# Patient Record
Sex: Female | Born: 1965 | Race: White | Hispanic: No | Marital: Single | Smoking: Never smoker
Health system: Southern US, Community
[De-identification: ages and names within clinical notes are randomized; demographics above are authoritative.]

## PROBLEM LIST (undated history)

## (undated) ENCOUNTER — Emergency Department (HOSPITAL_COMMUNITY): Disposition: A | Discharge status: Still In Hospital | Payer: Medicaid Other

## (undated) DIAGNOSIS — N390 Urinary tract infection, site not specified: Secondary | ICD-10-CM

## (undated) DIAGNOSIS — I739 Peripheral vascular disease, unspecified: Secondary | ICD-10-CM

## (undated) DIAGNOSIS — F419 Anxiety disorder, unspecified: Secondary | ICD-10-CM

## (undated) HISTORY — DX: Peripheral vascular disease, unspecified: I73.9

## (undated) HISTORY — PX: APPENDECTOMY: SHX54

---

## 1999-01-15 ENCOUNTER — Emergency Department (HOSPITAL_COMMUNITY): Admission: EM | Admit: 1999-01-15 | Discharge: 1999-01-15 | Payer: Self-pay | Admitting: Emergency Medicine

## 1999-01-15 ENCOUNTER — Encounter: Payer: Self-pay | Admitting: Emergency Medicine

## 2001-05-01 ENCOUNTER — Emergency Department (HOSPITAL_COMMUNITY): Admission: EM | Admit: 2001-05-01 | Discharge: 2001-05-01 | Payer: Self-pay | Admitting: Emergency Medicine

## 2002-01-08 ENCOUNTER — Encounter: Admission: RE | Admit: 2002-01-08 | Discharge: 2002-04-08 | Payer: Self-pay | Admitting: Family Medicine

## 2002-05-21 ENCOUNTER — Other Ambulatory Visit: Admission: RE | Admit: 2002-05-21 | Discharge: 2002-05-21 | Payer: Self-pay | Admitting: Family Medicine

## 2002-06-04 DIAGNOSIS — F329 Major depressive disorder, single episode, unspecified: Secondary | ICD-10-CM | POA: Insufficient documentation

## 2002-06-04 DIAGNOSIS — I868 Varicose veins of other specified sites: Secondary | ICD-10-CM | POA: Insufficient documentation

## 2002-06-04 DIAGNOSIS — F3289 Other specified depressive episodes: Secondary | ICD-10-CM | POA: Insufficient documentation

## 2003-02-07 ENCOUNTER — Emergency Department (HOSPITAL_COMMUNITY): Admission: AD | Admit: 2003-02-07 | Discharge: 2003-02-07 | Payer: Self-pay | Admitting: Emergency Medicine

## 2003-06-30 ENCOUNTER — Other Ambulatory Visit: Admission: RE | Admit: 2003-06-30 | Discharge: 2003-06-30 | Payer: Self-pay | Admitting: Family Medicine

## 2004-03-25 ENCOUNTER — Emergency Department (HOSPITAL_COMMUNITY): Admission: EM | Admit: 2004-03-25 | Discharge: 2004-03-25 | Payer: Self-pay | Admitting: Emergency Medicine

## 2004-04-18 ENCOUNTER — Ambulatory Visit: Payer: Self-pay | Admitting: Family Medicine

## 2004-07-02 ENCOUNTER — Emergency Department (HOSPITAL_COMMUNITY): Admission: EM | Admit: 2004-07-02 | Discharge: 2004-07-03 | Payer: Self-pay | Admitting: Emergency Medicine

## 2004-07-05 ENCOUNTER — Ambulatory Visit: Payer: Self-pay | Admitting: Family Medicine

## 2004-08-07 ENCOUNTER — Ambulatory Visit: Payer: Self-pay | Admitting: Family Medicine

## 2004-08-09 ENCOUNTER — Emergency Department (HOSPITAL_COMMUNITY): Admission: EM | Admit: 2004-08-09 | Discharge: 2004-08-09 | Payer: Self-pay | Admitting: Emergency Medicine

## 2004-10-09 ENCOUNTER — Other Ambulatory Visit: Admission: RE | Admit: 2004-10-09 | Discharge: 2004-10-09 | Payer: Self-pay | Admitting: Family Medicine

## 2004-10-09 ENCOUNTER — Ambulatory Visit: Payer: Self-pay | Admitting: Family Medicine

## 2004-10-11 ENCOUNTER — Ambulatory Visit: Payer: Self-pay | Admitting: Family Medicine

## 2004-10-16 ENCOUNTER — Ambulatory Visit (HOSPITAL_COMMUNITY): Admission: RE | Admit: 2004-10-16 | Discharge: 2004-10-16 | Payer: Self-pay | Admitting: Family Medicine

## 2005-01-04 ENCOUNTER — Ambulatory Visit: Payer: Self-pay | Admitting: Family Medicine

## 2005-01-07 ENCOUNTER — Inpatient Hospital Stay (HOSPITAL_COMMUNITY): Admission: AD | Admit: 2005-01-07 | Discharge: 2005-01-07 | Payer: Self-pay | Admitting: Obstetrics & Gynecology

## 2005-01-09 ENCOUNTER — Ambulatory Visit: Payer: Self-pay | Admitting: Obstetrics and Gynecology

## 2005-01-29 ENCOUNTER — Ambulatory Visit: Payer: Self-pay | Admitting: Family Medicine

## 2005-02-19 ENCOUNTER — Emergency Department (HOSPITAL_COMMUNITY): Admission: EM | Admit: 2005-02-19 | Discharge: 2005-02-19 | Payer: Self-pay | Admitting: Family Medicine

## 2005-04-30 ENCOUNTER — Emergency Department (HOSPITAL_COMMUNITY): Admission: EM | Admit: 2005-04-30 | Discharge: 2005-04-30 | Payer: Self-pay | Admitting: Family Medicine

## 2005-05-30 ENCOUNTER — Emergency Department (HOSPITAL_COMMUNITY): Admission: EM | Admit: 2005-05-30 | Discharge: 2005-05-30 | Payer: Self-pay | Admitting: Emergency Medicine

## 2005-05-31 ENCOUNTER — Emergency Department (HOSPITAL_COMMUNITY): Admission: EM | Admit: 2005-05-31 | Discharge: 2005-05-31 | Payer: Self-pay | Admitting: Emergency Medicine

## 2005-07-23 ENCOUNTER — Ambulatory Visit: Payer: Self-pay | Admitting: Internal Medicine

## 2005-07-29 ENCOUNTER — Emergency Department (HOSPITAL_COMMUNITY): Admission: EM | Admit: 2005-07-29 | Discharge: 2005-07-29 | Payer: Self-pay | Admitting: Emergency Medicine

## 2005-08-09 ENCOUNTER — Ambulatory Visit: Payer: Self-pay | Admitting: Family Medicine

## 2005-08-30 ENCOUNTER — Ambulatory Visit: Payer: Self-pay | Admitting: Family Medicine

## 2005-09-14 ENCOUNTER — Emergency Department (HOSPITAL_COMMUNITY): Admission: EM | Admit: 2005-09-14 | Discharge: 2005-09-14 | Payer: Self-pay | Admitting: Family Medicine

## 2005-09-16 ENCOUNTER — Emergency Department (HOSPITAL_COMMUNITY): Admission: EM | Admit: 2005-09-16 | Discharge: 2005-09-16 | Payer: Self-pay | Admitting: Emergency Medicine

## 2005-09-27 ENCOUNTER — Emergency Department (HOSPITAL_COMMUNITY): Admission: EM | Admit: 2005-09-27 | Discharge: 2005-09-27 | Payer: Self-pay | Admitting: Emergency Medicine

## 2005-10-09 ENCOUNTER — Ambulatory Visit: Payer: Self-pay | Admitting: Internal Medicine

## 2005-10-18 ENCOUNTER — Ambulatory Visit (HOSPITAL_COMMUNITY): Admission: RE | Admit: 2005-10-18 | Discharge: 2005-10-18 | Payer: Self-pay | Admitting: Family Medicine

## 2005-10-19 ENCOUNTER — Ambulatory Visit: Payer: Self-pay | Admitting: Family Medicine

## 2005-11-05 ENCOUNTER — Other Ambulatory Visit: Admission: RE | Admit: 2005-11-05 | Discharge: 2005-11-05 | Payer: Self-pay | Admitting: Family Medicine

## 2005-11-05 ENCOUNTER — Ambulatory Visit: Payer: Self-pay | Admitting: Family Medicine

## 2005-11-05 LAB — CONVERTED CEMR LAB: Pap Smear: NORMAL

## 2005-11-14 ENCOUNTER — Ambulatory Visit: Payer: Self-pay | Admitting: Family Medicine

## 2005-11-15 ENCOUNTER — Emergency Department (HOSPITAL_COMMUNITY): Admission: EM | Admit: 2005-11-15 | Discharge: 2005-11-15 | Payer: Self-pay | Admitting: Emergency Medicine

## 2006-01-16 ENCOUNTER — Ambulatory Visit: Payer: Self-pay | Admitting: Family Medicine

## 2006-03-04 ENCOUNTER — Ambulatory Visit: Payer: Self-pay | Admitting: Family Medicine

## 2006-03-11 ENCOUNTER — Ambulatory Visit: Payer: Self-pay | Admitting: Family Medicine

## 2006-04-02 ENCOUNTER — Ambulatory Visit: Payer: Self-pay | Admitting: Family Medicine

## 2006-04-17 ENCOUNTER — Emergency Department (HOSPITAL_COMMUNITY): Admission: EM | Admit: 2006-04-17 | Discharge: 2006-04-17 | Payer: Self-pay | Admitting: Family Medicine

## 2006-04-25 ENCOUNTER — Emergency Department (HOSPITAL_COMMUNITY): Admission: EM | Admit: 2006-04-25 | Discharge: 2006-04-25 | Payer: Self-pay | Admitting: Emergency Medicine

## 2006-05-03 ENCOUNTER — Ambulatory Visit: Payer: Self-pay | Admitting: Internal Medicine

## 2006-05-14 ENCOUNTER — Ambulatory Visit: Payer: Self-pay | Admitting: Family Medicine

## 2006-08-06 ENCOUNTER — Emergency Department (HOSPITAL_COMMUNITY): Admission: EM | Admit: 2006-08-06 | Discharge: 2006-08-06 | Payer: Self-pay | Admitting: Emergency Medicine

## 2006-08-08 ENCOUNTER — Emergency Department (HOSPITAL_COMMUNITY): Admission: EM | Admit: 2006-08-08 | Discharge: 2006-08-08 | Payer: Self-pay | Admitting: Emergency Medicine

## 2006-08-12 ENCOUNTER — Ambulatory Visit: Payer: Self-pay | Admitting: Internal Medicine

## 2006-09-07 ENCOUNTER — Emergency Department (HOSPITAL_COMMUNITY): Admission: EM | Admit: 2006-09-07 | Discharge: 2006-09-07 | Payer: Self-pay | Admitting: *Deleted

## 2006-09-16 ENCOUNTER — Ambulatory Visit: Payer: Self-pay | Admitting: Family Medicine

## 2006-09-21 ENCOUNTER — Emergency Department (HOSPITAL_COMMUNITY): Admission: EM | Admit: 2006-09-21 | Discharge: 2006-09-21 | Payer: Self-pay | Admitting: Emergency Medicine

## 2006-09-23 ENCOUNTER — Inpatient Hospital Stay (HOSPITAL_COMMUNITY): Admission: AD | Admit: 2006-09-23 | Discharge: 2006-09-23 | Payer: Self-pay | Admitting: Family Medicine

## 2006-09-26 ENCOUNTER — Ambulatory Visit: Payer: Self-pay | Admitting: Internal Medicine

## 2006-10-30 ENCOUNTER — Ambulatory Visit (HOSPITAL_COMMUNITY): Admission: RE | Admit: 2006-10-30 | Discharge: 2006-10-30 | Payer: Self-pay | Admitting: Family Medicine

## 2006-11-03 ENCOUNTER — Emergency Department (HOSPITAL_COMMUNITY): Admission: EM | Admit: 2006-11-03 | Discharge: 2006-11-03 | Payer: Self-pay | Admitting: Emergency Medicine

## 2006-11-07 IMAGING — CT CT ANGIO CHEST
1 of 2 series · 20 of 30 positions shown · IV contrast (APPLIED)
Comparison: none

CLINICAL DATA: Chest pain; elevated D-dimer; heart stop
TECHNIQUE: Multidetector CT imaging of the chest was performed according to the protocol for detection of pulmonary embolism during IV bolus injection of 80 ml Omnipaque 300.  Coronal and sagittal plane reformatted images were also generated.   
 CHEST CT ANGIO WITH CONTRAST:
 There are no filling defects in the pulmonary arterial system to suggest pulmonary emboli.  The heart and great vessels are unremarkable.  There is no evidence of aortic dissection or aneurysm.  There are no pleural or pericardial effusions.  No enlarged lymph nodes are present in the axillary, mediastinal, or hilar regions.  The lungs are clear.  Left renal cyst is identified.

[Series 5: pe 1.0 b20f st · axial · 0.59mm/px · z∈[-1,+225]mm · 20 of 252 slices shown]
[im 13/252  lung]
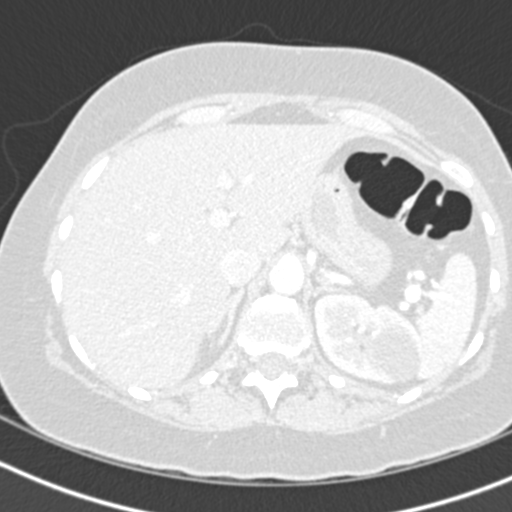
[im 26/252  mediastinal]
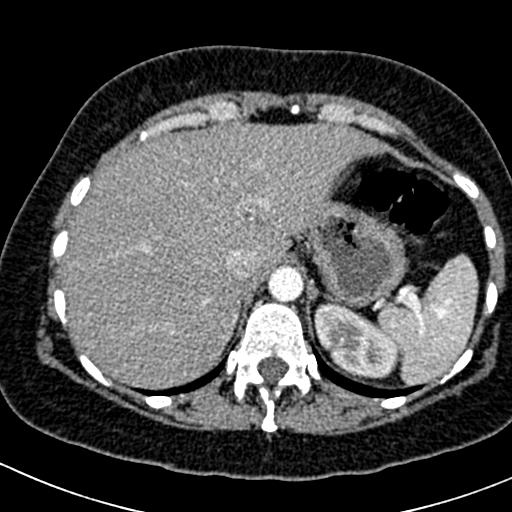
[im 38/252  lung]
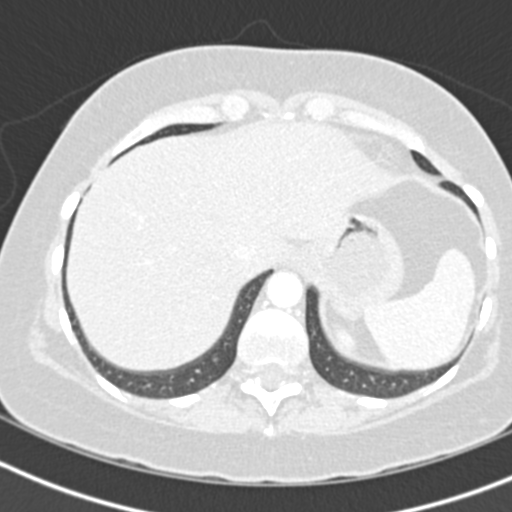
[im 51/252  mediastinal]
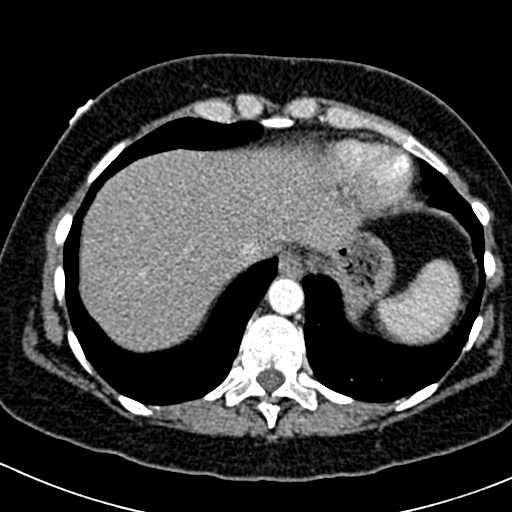
[im 63/252  lung]
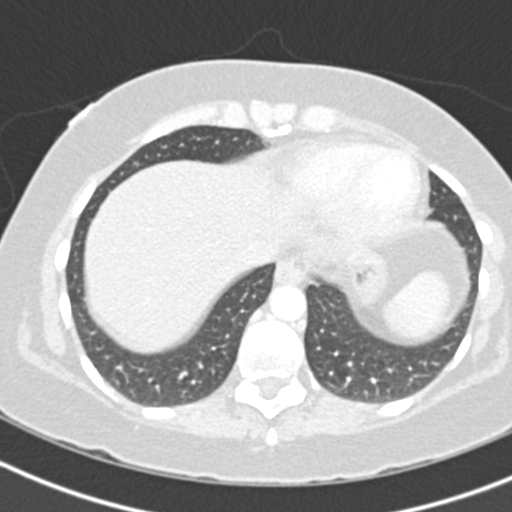
[im 76/252  mediastinal]
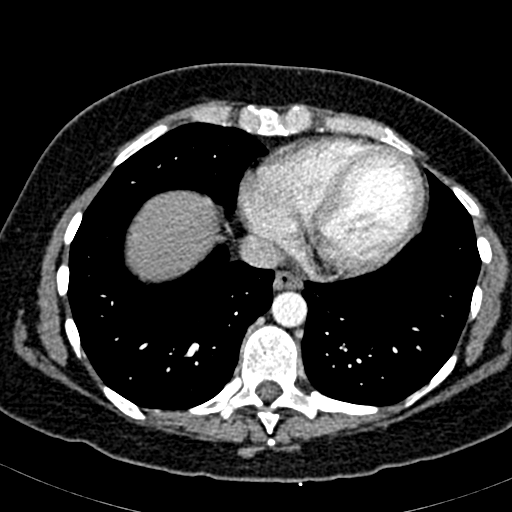
[im 88/252  lung]
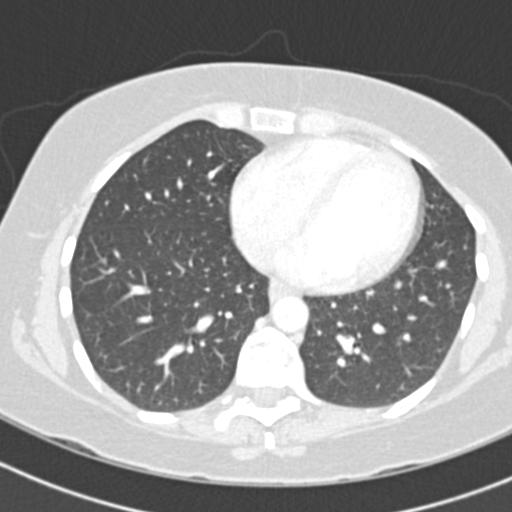
[im 101/252  mediastinal]
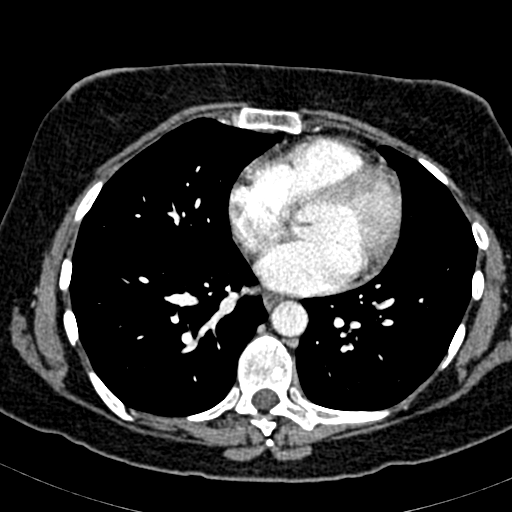
[im 113/252  lung]
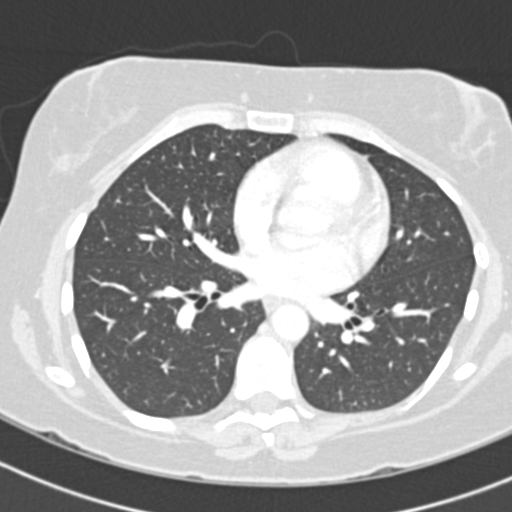
[im 117/252  mediastinal]
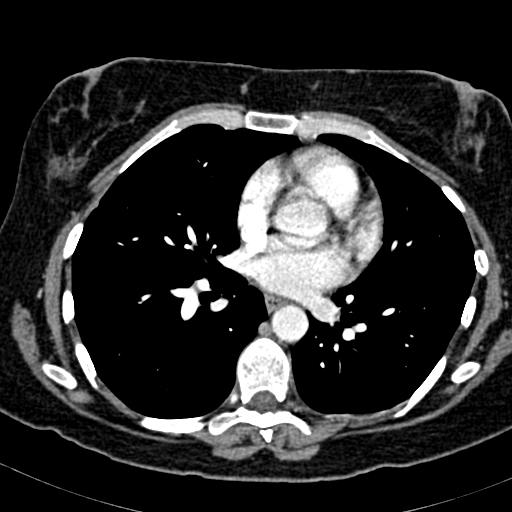
[im 126/252  lung]
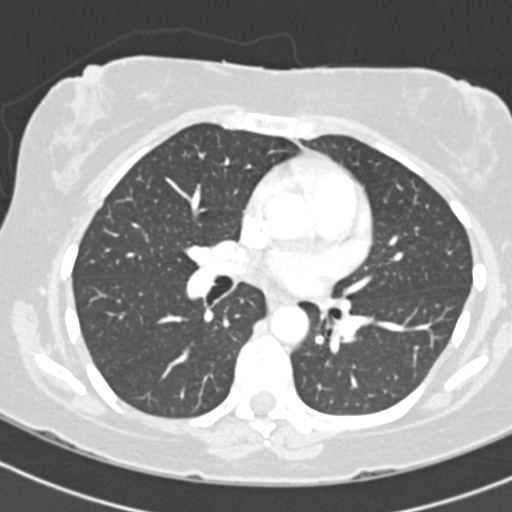
[im 139/252  mediastinal]
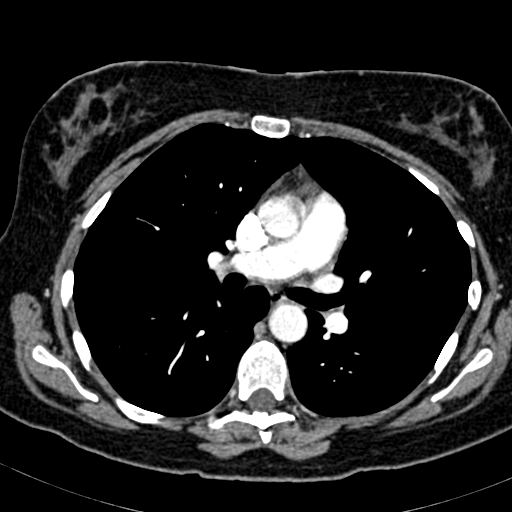
[im 151/252  lung]
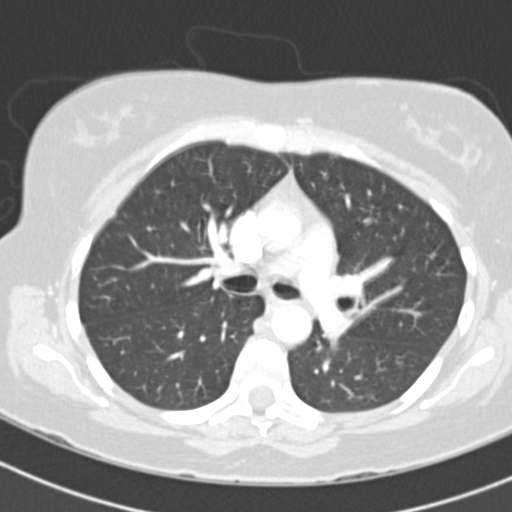
[im 164/252  mediastinal]
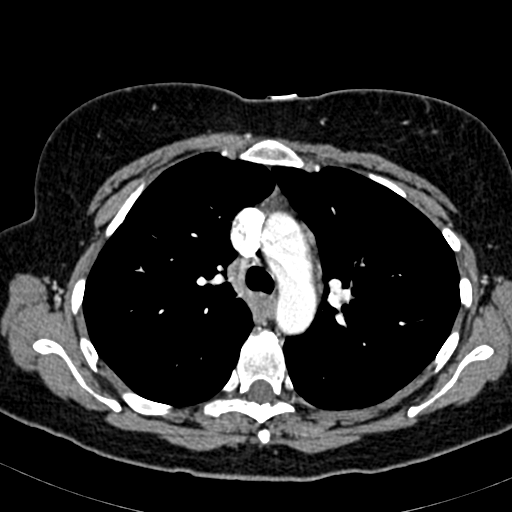
[im 176/252  lung]
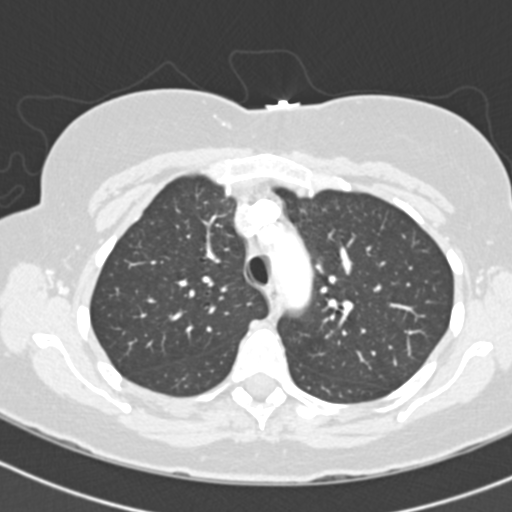
[im 189/252  mediastinal]
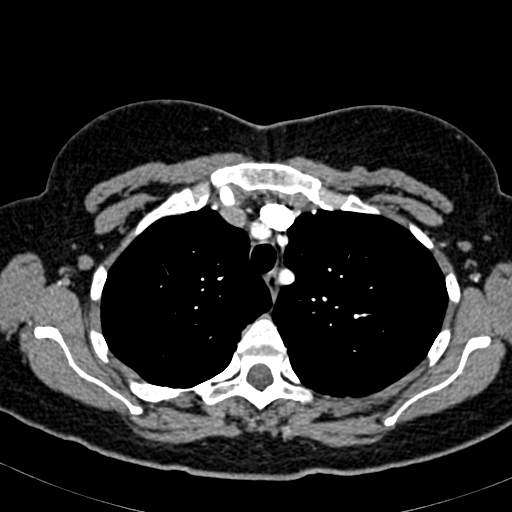
[im 201/252  lung]
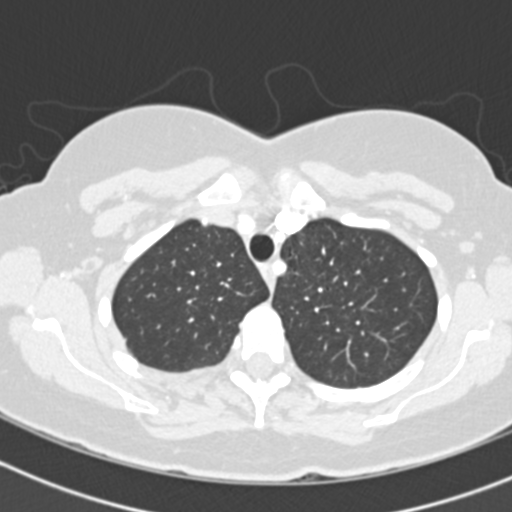
[im 214/252  mediastinal]
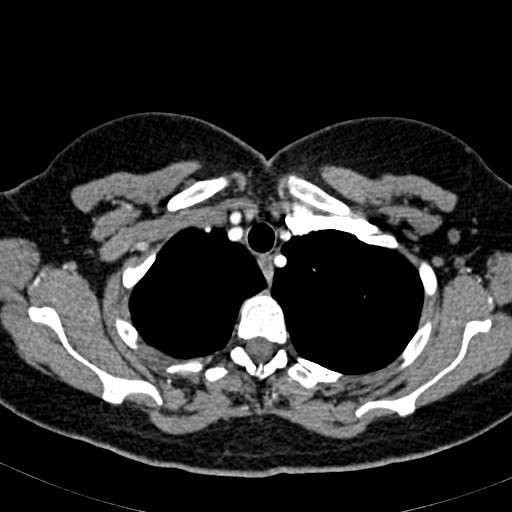
[im 226/252  lung]
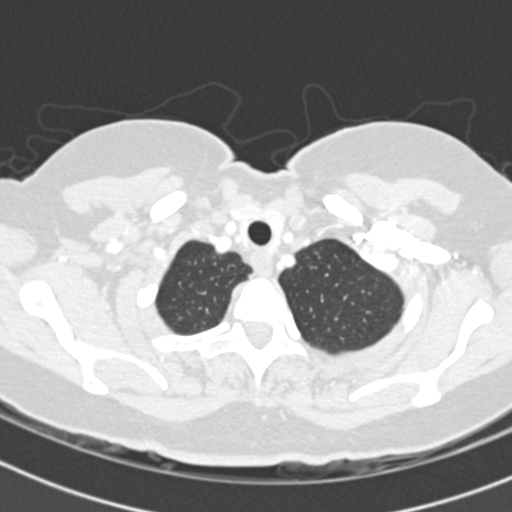
[im 239/252  mediastinal]
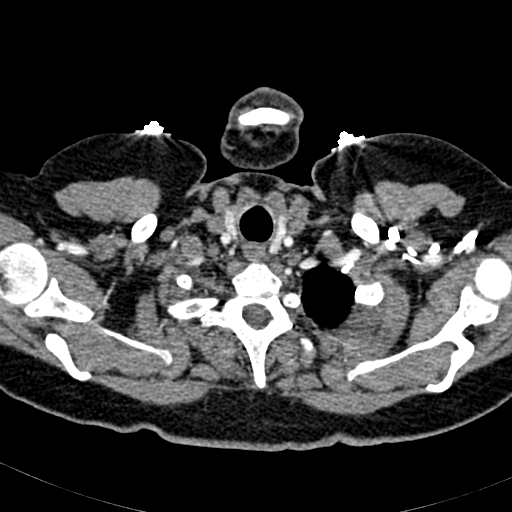

[20 of 30 positions shown; findings below may reference images not displayed]

IMPRESSION: No acute abnormality.

## 2006-12-04 ENCOUNTER — Emergency Department (HOSPITAL_COMMUNITY): Admission: EM | Admit: 2006-12-04 | Discharge: 2006-12-04 | Payer: Self-pay | Admitting: Family Medicine

## 2006-12-14 ENCOUNTER — Emergency Department (HOSPITAL_COMMUNITY): Admission: EM | Admit: 2006-12-14 | Discharge: 2006-12-14 | Payer: Self-pay | Admitting: Emergency Medicine

## 2006-12-23 ENCOUNTER — Emergency Department (HOSPITAL_COMMUNITY): Admission: EM | Admit: 2006-12-23 | Discharge: 2006-12-24 | Payer: Self-pay | Admitting: Emergency Medicine

## 2006-12-29 ENCOUNTER — Inpatient Hospital Stay (HOSPITAL_COMMUNITY): Admission: AD | Admit: 2006-12-29 | Discharge: 2006-12-29 | Payer: Self-pay | Admitting: Family Medicine

## 2006-12-31 ENCOUNTER — Emergency Department (HOSPITAL_COMMUNITY): Admission: EM | Admit: 2006-12-31 | Discharge: 2007-01-01 | Payer: Self-pay | Admitting: Emergency Medicine

## 2007-01-02 ENCOUNTER — Emergency Department (HOSPITAL_COMMUNITY): Admission: EM | Admit: 2007-01-02 | Discharge: 2007-01-02 | Payer: Self-pay | Admitting: Emergency Medicine

## 2007-01-04 ENCOUNTER — Emergency Department (HOSPITAL_COMMUNITY): Admission: EM | Admit: 2007-01-04 | Discharge: 2007-01-04 | Payer: Self-pay | Admitting: Emergency Medicine

## 2007-01-21 ENCOUNTER — Other Ambulatory Visit: Admission: RE | Admit: 2007-01-21 | Discharge: 2007-01-21 | Payer: Self-pay | Admitting: Family Medicine

## 2007-01-21 ENCOUNTER — Ambulatory Visit: Payer: Self-pay | Admitting: Family Medicine

## 2007-01-21 DIAGNOSIS — F411 Generalized anxiety disorder: Secondary | ICD-10-CM | POA: Insufficient documentation

## 2007-01-22 ENCOUNTER — Encounter (INDEPENDENT_AMBULATORY_CARE_PROVIDER_SITE_OTHER): Payer: Self-pay | Admitting: Family Medicine

## 2007-01-22 LAB — CONVERTED CEMR LAB
ALT: 18 units/L (ref 0–35)
AST: 16 units/L (ref 0–37)
Albumin: 3.9 g/dL (ref 3.5–5.2)
Alkaline Phosphatase: 65 units/L (ref 39–117)
BUN: 13 mg/dL (ref 6–23)
Basophils Absolute: 0 10*3/uL (ref 0.0–0.1)
Basophils Relative: 0 % (ref 0–1)
CO2: 21 meq/L (ref 19–32)
Calcium: 9.2 mg/dL (ref 8.4–10.5)
Chloride: 107 meq/L (ref 96–112)
Cholesterol: 127 mg/dL (ref 0–200)
Creatinine, Ser: 0.51 mg/dL (ref 0.40–1.20)
Eosinophils Absolute: 0.1 10*3/uL (ref 0.0–0.7)
Eosinophils Relative: 1 % (ref 0–5)
Free Thyroxine Index: 2.6 (ref 1.0–3.9)
GC Probe Amp, Genital: NEGATIVE
Glucose, Bld: 87 mg/dL (ref 70–99)
HCT: 37 % (ref 36.0–46.0)
HDL: 57 mg/dL (ref >39)
Hemoglobin: 11.8 g/dL — ABNORMAL LOW (ref 12.0–15.0)
LDL Cholesterol: 57 mg/dL (ref 0–99)
Lymphocytes Relative: 19 % (ref 12–46)
Lymphs Abs: 1.4 10*3/uL (ref 0.7–3.3)
MCHC: 31.9 g/dL (ref 30.0–36.0)
MCV: 81 fL (ref 78.0–100.0)
Monocytes Absolute: 0.5 10*3/uL (ref 0.2–0.7)
Monocytes Relative: 7 % (ref 3–11)
Neutro Abs: 5.5 10*3/uL (ref 1.7–7.7)
Neutrophils Relative %: 74 % (ref 43–77)
Platelets: 323 10*3/uL (ref 150–400)
Potassium: 4.3 meq/L (ref 3.5–5.3)
RBC: 4.57 M/uL (ref 3.87–5.11)
RDW: 16.6 % — ABNORMAL HIGH (ref 11.5–14.0)
Sodium: 138 meq/L (ref 135–145)
T3 Uptake Ratio: 26.3 % (ref 22.5–37.0)
T4, Total: 9.8 ug/dL (ref 5.0–12.5)
TSH: 1.556 microintl units/mL (ref 0.350–5.50)
Total Bilirubin: 0.5 mg/dL (ref 0.3–1.2)
Total CHOL/HDL Ratio: 2.2
Total Protein: 7.1 g/dL (ref 6.0–8.3)
Triglycerides: 66 mg/dL (ref <150)
VLDL: 13 mg/dL (ref 0–40)
WBC: 7.5 10*3/uL (ref 4.0–10.5)

## 2007-01-27 DIAGNOSIS — IMO0002 Reserved for concepts with insufficient information to code with codable children: Secondary | ICD-10-CM | POA: Insufficient documentation

## 2007-01-27 DIAGNOSIS — F410 Panic disorder [episodic paroxysmal anxiety] without agoraphobia: Secondary | ICD-10-CM | POA: Insufficient documentation

## 2007-01-29 ENCOUNTER — Telehealth (INDEPENDENT_AMBULATORY_CARE_PROVIDER_SITE_OTHER): Payer: Self-pay | Admitting: *Deleted

## 2007-02-03 ENCOUNTER — Emergency Department (HOSPITAL_COMMUNITY): Admission: EM | Admit: 2007-02-03 | Discharge: 2007-02-03 | Payer: Self-pay | Admitting: Emergency Medicine

## 2007-02-16 ENCOUNTER — Emergency Department (HOSPITAL_COMMUNITY): Admission: EM | Admit: 2007-02-16 | Discharge: 2007-02-16 | Payer: Self-pay | Admitting: Emergency Medicine

## 2007-02-19 ENCOUNTER — Telehealth (INDEPENDENT_AMBULATORY_CARE_PROVIDER_SITE_OTHER): Payer: Self-pay | Admitting: *Deleted

## 2007-03-05 ENCOUNTER — Inpatient Hospital Stay (HOSPITAL_COMMUNITY): Admission: AD | Admit: 2007-03-05 | Discharge: 2007-03-05 | Payer: Self-pay | Admitting: Obstetrics & Gynecology

## 2007-03-06 ENCOUNTER — Encounter (INDEPENDENT_AMBULATORY_CARE_PROVIDER_SITE_OTHER): Payer: Self-pay | Admitting: Family Medicine

## 2007-03-06 ENCOUNTER — Emergency Department (HOSPITAL_COMMUNITY): Admission: EM | Admit: 2007-03-06 | Discharge: 2007-03-06 | Payer: Self-pay | Admitting: *Deleted

## 2007-03-06 ENCOUNTER — Telehealth (INDEPENDENT_AMBULATORY_CARE_PROVIDER_SITE_OTHER): Payer: Self-pay | Admitting: Family Medicine

## 2007-03-07 ENCOUNTER — Emergency Department (HOSPITAL_COMMUNITY): Admission: EM | Admit: 2007-03-07 | Discharge: 2007-03-07 | Payer: Self-pay | Admitting: Family Medicine

## 2007-03-08 ENCOUNTER — Inpatient Hospital Stay (HOSPITAL_COMMUNITY): Admission: AD | Admit: 2007-03-08 | Discharge: 2007-03-08 | Payer: Self-pay | Admitting: Obstetrics & Gynecology

## 2007-03-21 ENCOUNTER — Emergency Department (HOSPITAL_COMMUNITY): Admission: EM | Admit: 2007-03-21 | Discharge: 2007-03-21 | Payer: Self-pay | Admitting: Emergency Medicine

## 2007-04-04 ENCOUNTER — Emergency Department (HOSPITAL_COMMUNITY): Admission: EM | Admit: 2007-04-04 | Discharge: 2007-04-04 | Payer: Self-pay | Admitting: Emergency Medicine

## 2007-04-06 ENCOUNTER — Emergency Department (HOSPITAL_COMMUNITY): Admission: EM | Admit: 2007-04-06 | Discharge: 2007-04-07 | Payer: Self-pay | Admitting: Emergency Medicine

## 2007-04-09 ENCOUNTER — Emergency Department (HOSPITAL_COMMUNITY): Admission: EM | Admit: 2007-04-09 | Discharge: 2007-04-09 | Payer: Self-pay | Admitting: Emergency Medicine

## 2007-04-10 ENCOUNTER — Emergency Department (HOSPITAL_COMMUNITY): Admission: EM | Admit: 2007-04-10 | Discharge: 2007-04-11 | Payer: Self-pay | Admitting: Emergency Medicine

## 2007-04-10 ENCOUNTER — Emergency Department (HOSPITAL_COMMUNITY): Admission: EM | Admit: 2007-04-10 | Discharge: 2007-04-10 | Payer: Self-pay | Admitting: Emergency Medicine

## 2007-04-14 ENCOUNTER — Emergency Department (HOSPITAL_COMMUNITY): Admission: EM | Admit: 2007-04-14 | Discharge: 2007-04-14 | Payer: Self-pay | Admitting: Emergency Medicine

## 2007-04-26 ENCOUNTER — Emergency Department (HOSPITAL_COMMUNITY): Admission: EM | Admit: 2007-04-26 | Discharge: 2007-04-26 | Payer: Self-pay | Admitting: Emergency Medicine

## 2007-06-26 ENCOUNTER — Emergency Department (HOSPITAL_COMMUNITY): Admission: EM | Admit: 2007-06-26 | Discharge: 2007-06-26 | Payer: Self-pay | Admitting: Family Medicine

## 2007-07-22 ENCOUNTER — Emergency Department (HOSPITAL_COMMUNITY): Admission: EM | Admit: 2007-07-22 | Discharge: 2007-07-22 | Payer: Self-pay | Admitting: Emergency Medicine

## 2007-08-08 ENCOUNTER — Emergency Department (HOSPITAL_COMMUNITY): Admission: EM | Admit: 2007-08-08 | Discharge: 2007-08-08 | Payer: Self-pay | Admitting: Family Medicine

## 2007-08-16 ENCOUNTER — Encounter (INDEPENDENT_AMBULATORY_CARE_PROVIDER_SITE_OTHER): Payer: Self-pay | Admitting: Family Medicine

## 2007-08-17 ENCOUNTER — Emergency Department (HOSPITAL_COMMUNITY): Admission: EM | Admit: 2007-08-17 | Discharge: 2007-08-17 | Payer: Self-pay | Admitting: Emergency Medicine

## 2007-10-31 ENCOUNTER — Ambulatory Visit (HOSPITAL_COMMUNITY): Admission: RE | Admit: 2007-10-31 | Discharge: 2007-10-31 | Payer: Self-pay | Admitting: Family Medicine

## 2008-01-22 ENCOUNTER — Inpatient Hospital Stay (HOSPITAL_COMMUNITY): Admission: AD | Admit: 2008-01-22 | Discharge: 2008-01-22 | Payer: Self-pay | Admitting: Family Medicine

## 2008-02-07 ENCOUNTER — Emergency Department: Payer: Self-pay | Admitting: Emergency Medicine

## 2008-03-05 ENCOUNTER — Emergency Department (HOSPITAL_COMMUNITY): Admission: EM | Admit: 2008-03-05 | Discharge: 2008-03-05 | Payer: Self-pay | Admitting: Emergency Medicine

## 2008-03-10 ENCOUNTER — Encounter (INDEPENDENT_AMBULATORY_CARE_PROVIDER_SITE_OTHER): Payer: Self-pay | Admitting: Family Medicine

## 2008-03-10 ENCOUNTER — Other Ambulatory Visit: Admission: RE | Admit: 2008-03-10 | Discharge: 2008-03-10 | Payer: Self-pay | Admitting: Family Medicine

## 2008-03-10 ENCOUNTER — Ambulatory Visit: Payer: Self-pay | Admitting: Family Medicine

## 2008-03-10 DIAGNOSIS — G4700 Insomnia, unspecified: Secondary | ICD-10-CM | POA: Insufficient documentation

## 2008-03-10 DIAGNOSIS — M545 Low back pain, unspecified: Secondary | ICD-10-CM | POA: Insufficient documentation

## 2008-03-10 LAB — CONVERTED CEMR LAB
ALT: 17 U/L (ref 0–35)
AST: 15 U/L (ref 0–37)
Albumin: 4.1 g/dL (ref 3.5–5.2)
Alkaline Phosphatase: 74 U/L (ref 39–117)
BUN: 13 mg/dL (ref 6–23)
Basophils Absolute: 0.1 K/uL (ref 0.0–0.1)
Basophils Relative: 1 % (ref 0–1)
Bilirubin Urine: NEGATIVE
Blood in Urine, dipstick: NEGATIVE
CO2: 23 meq/L (ref 19–32)
Calcium: 9.7 mg/dL (ref 8.4–10.5)
Chlamydia, DNA Probe: NEGATIVE
Chloride: 102 meq/L (ref 96–112)
Cholesterol: 160 mg/dL (ref 0–200)
Creatinine, Ser: 0.63 mg/dL (ref 0.40–1.20)
Eosinophils Absolute: 0.1 K/uL (ref 0.0–0.7)
Eosinophils Relative: 1 % (ref 0–5)
GC Probe Amp, Genital: NEGATIVE
Glucose, Bld: 78 mg/dL (ref 70–99)
Glucose, Urine, Semiquant: NEGATIVE
HCT: 38.5 % (ref 36.0–46.0)
HDL: 62 mg/dL (ref >39)
Hemoglobin: 11.9 g/dL — ABNORMAL LOW (ref 12.0–15.0)
Ketones, urine, test strip: NEGATIVE
LDL Cholesterol: 72 mg/dL (ref 0–99)
Lymphocytes Relative: 28 % (ref 12–46)
Lymphs Abs: 2.4 K/uL (ref 0.7–4.0)
MCHC: 30.9 g/dL (ref 30.0–36.0)
MCV: 82.1 fL (ref 78.0–100.0)
Monocytes Absolute: 0.8 K/uL (ref 0.1–1.0)
Monocytes Relative: 10 % (ref 3–12)
Neutro Abs: 5.1 K/uL (ref 1.7–7.7)
Neutrophils Relative %: 60 % (ref 43–77)
Nitrite: NEGATIVE
Platelets: 347 K/uL (ref 150–400)
Potassium: 4.2 meq/L (ref 3.5–5.3)
Protein, U semiquant: NEGATIVE
RBC: 4.69 M/uL (ref 3.87–5.11)
RDW: 16.4 % — ABNORMAL HIGH (ref 11.5–15.5)
Sodium: 137 meq/L (ref 135–145)
Specific Gravity, Urine: 1.015
TSH: 1.63 u[IU]/mL (ref 0.350–4.50)
Total Bilirubin: 0.4 mg/dL (ref 0.3–1.2)
Total CHOL/HDL Ratio: 2.6
Total Protein: 7.2 g/dL (ref 6.0–8.3)
Triglycerides: 130 mg/dL (ref <150)
Urobilinogen, UA: 0.2
VLDL: 26 mg/dL (ref 0–40)
WBC Urine, dipstick: NEGATIVE
WBC: 8.5 10*3/microliter (ref 4.0–10.5)
pH: 7.5

## 2008-03-12 ENCOUNTER — Telehealth (INDEPENDENT_AMBULATORY_CARE_PROVIDER_SITE_OTHER): Payer: Self-pay | Admitting: Family Medicine

## 2008-03-18 ENCOUNTER — Telehealth (INDEPENDENT_AMBULATORY_CARE_PROVIDER_SITE_OTHER): Payer: Self-pay | Admitting: *Deleted

## 2008-03-24 ENCOUNTER — Encounter (INDEPENDENT_AMBULATORY_CARE_PROVIDER_SITE_OTHER): Payer: Self-pay | Admitting: Family Medicine

## 2008-05-17 ENCOUNTER — Emergency Department (HOSPITAL_COMMUNITY): Admission: EM | Admit: 2008-05-17 | Discharge: 2008-05-17 | Payer: Self-pay | Admitting: Emergency Medicine

## 2008-09-06 ENCOUNTER — Ambulatory Visit: Payer: Self-pay | Admitting: Family Medicine

## 2008-09-06 DIAGNOSIS — J069 Acute upper respiratory infection, unspecified: Secondary | ICD-10-CM | POA: Insufficient documentation

## 2008-09-06 DIAGNOSIS — L821 Other seborrheic keratosis: Secondary | ICD-10-CM | POA: Insufficient documentation

## 2008-09-06 LAB — CONVERTED CEMR LAB: Rapid Strep: NEGATIVE

## 2008-09-09 ENCOUNTER — Ambulatory Visit: Payer: Self-pay | Admitting: Family Medicine

## 2008-12-02 ENCOUNTER — Telehealth (INDEPENDENT_AMBULATORY_CARE_PROVIDER_SITE_OTHER): Payer: Self-pay | Admitting: Internal Medicine

## 2008-12-02 ENCOUNTER — Encounter (INDEPENDENT_AMBULATORY_CARE_PROVIDER_SITE_OTHER): Payer: Self-pay | Admitting: Internal Medicine

## 2008-12-02 ENCOUNTER — Ambulatory Visit (HOSPITAL_COMMUNITY): Admission: RE | Admit: 2008-12-02 | Discharge: 2008-12-02 | Payer: Self-pay | Admitting: Family Medicine

## 2008-12-08 ENCOUNTER — Encounter: Admission: RE | Admit: 2008-12-08 | Discharge: 2008-12-08 | Payer: Self-pay | Admitting: Family Medicine

## 2008-12-09 ENCOUNTER — Encounter (INDEPENDENT_AMBULATORY_CARE_PROVIDER_SITE_OTHER): Payer: Self-pay | Admitting: Nurse Practitioner

## 2008-12-10 ENCOUNTER — Telehealth (INDEPENDENT_AMBULATORY_CARE_PROVIDER_SITE_OTHER): Payer: Self-pay | Admitting: Internal Medicine

## 2008-12-15 ENCOUNTER — Encounter (INDEPENDENT_AMBULATORY_CARE_PROVIDER_SITE_OTHER): Payer: Self-pay | Admitting: Family Medicine

## 2009-01-24 ENCOUNTER — Ambulatory Visit: Payer: Self-pay | Admitting: Nurse Practitioner

## 2009-01-24 DIAGNOSIS — K5900 Constipation, unspecified: Secondary | ICD-10-CM | POA: Insufficient documentation

## 2009-01-24 DIAGNOSIS — K219 Gastro-esophageal reflux disease without esophagitis: Secondary | ICD-10-CM | POA: Insufficient documentation

## 2009-01-24 DIAGNOSIS — N926 Irregular menstruation, unspecified: Secondary | ICD-10-CM | POA: Insufficient documentation

## 2009-01-24 DIAGNOSIS — N63 Unspecified lump in unspecified breast: Secondary | ICD-10-CM | POA: Insufficient documentation

## 2009-01-24 LAB — CONVERTED CEMR LAB
Bilirubin Urine: NEGATIVE
Blood in Urine, dipstick: NEGATIVE
Glucose, Urine, Semiquant: NEGATIVE
Ketones, urine, test strip: NEGATIVE
Nitrite: NEGATIVE
Protein, U semiquant: NEGATIVE
Specific Gravity, Urine: 1.015
Urobilinogen, UA: 0.2
WBC Urine, dipstick: NEGATIVE
pH: 7

## 2009-06-02 ENCOUNTER — Ambulatory Visit: Payer: Self-pay | Admitting: Nurse Practitioner

## 2009-06-02 DIAGNOSIS — L719 Rosacea, unspecified: Secondary | ICD-10-CM | POA: Insufficient documentation

## 2009-06-02 LAB — CONVERTED CEMR LAB
ALT: 24 units/L (ref 0–35)
AST: 17 units/L (ref 0–37)
Albumin: 4.1 g/dL (ref 3.5–5.2)
Alkaline Phosphatase: 81 units/L (ref 39–117)
BUN: 11 mg/dL (ref 6–23)
Basophils Absolute: 0.1 10*3/uL (ref 0.0–0.1)
Basophils Relative: 1 % (ref 0–1)
Bilirubin Urine: NEGATIVE
Blood in Urine, dipstick: NEGATIVE
CO2: 23 meq/L (ref 19–32)
Calcium: 9.3 mg/dL (ref 8.4–10.5)
Chlamydia, DNA Probe: NEGATIVE
Chloride: 102 meq/L (ref 96–112)
Cholesterol: 159 mg/dL (ref 0–200)
Creatinine, Ser: 0.42 mg/dL (ref 0.40–1.20)
Eosinophils Absolute: 0.1 10*3/uL (ref 0.0–0.7)
Eosinophils Relative: 1 % (ref 0–5)
GC Probe Amp, Genital: NEGATIVE
Glucose, Bld: 75 mg/dL (ref 70–99)
Glucose, Urine, Semiquant: NEGATIVE
HCT: 36.6 % (ref 36.0–46.0)
HDL: 62 mg/dL (ref >39)
Hemoglobin: 11.7 g/dL — ABNORMAL LOW (ref 12.0–15.0)
KOH Prep: NEGATIVE
Ketones, urine, test strip: NEGATIVE
LDL Cholesterol: 83 mg/dL (ref 0–99)
Lymphocytes Relative: 25 % (ref 12–46)
Lymphs Abs: 2.4 10*3/uL (ref 0.7–4.0)
MCHC: 32 g/dL (ref 30.0–36.0)
MCV: 80.1 fL (ref 78.0–100.0)
Monocytes Absolute: 0.7 10*3/uL (ref 0.1–1.0)
Monocytes Relative: 7 % (ref 3–12)
Neutro Abs: 6.4 10*3/uL (ref 1.7–7.7)
Neutrophils Relative %: 66 % (ref 43–77)
Nitrite: NEGATIVE
OCCULT 1: NEGATIVE
Platelets: 400 10*3/uL (ref 150–400)
Potassium: 4.9 meq/L (ref 3.5–5.3)
Protein, U semiquant: NEGATIVE
RBC: 4.57 M/uL (ref 3.87–5.11)
RDW: 16.8 % — ABNORMAL HIGH (ref 11.5–15.5)
Rapid HIV Screen: NEGATIVE
Sodium: 138 meq/L (ref 135–145)
Specific Gravity, Urine: 1.01
TSH: 1.52 microintl units/mL (ref 0.350–4.500)
Total Bilirubin: 0.4 mg/dL (ref 0.3–1.2)
Total CHOL/HDL Ratio: 2.6
Total Protein: 7.2 g/dL (ref 6.0–8.3)
Triglycerides: 70 mg/dL (ref <150)
Urobilinogen, UA: 0.2
VLDL: 14 mg/dL (ref 0–40)
WBC Urine, dipstick: NEGATIVE
WBC: 9.7 10*3/uL (ref 4.0–10.5)
pH: 6.5

## 2009-06-06 ENCOUNTER — Telehealth (INDEPENDENT_AMBULATORY_CARE_PROVIDER_SITE_OTHER): Payer: Self-pay | Admitting: Nurse Practitioner

## 2009-06-09 ENCOUNTER — Encounter: Admission: RE | Admit: 2009-06-09 | Discharge: 2009-06-09 | Payer: Self-pay | Admitting: Internal Medicine

## 2009-06-10 ENCOUNTER — Encounter (INDEPENDENT_AMBULATORY_CARE_PROVIDER_SITE_OTHER): Payer: Self-pay | Admitting: Nurse Practitioner

## 2009-06-10 ENCOUNTER — Telehealth (INDEPENDENT_AMBULATORY_CARE_PROVIDER_SITE_OTHER): Payer: Self-pay | Admitting: Nurse Practitioner

## 2009-07-13 IMAGING — US US PELVIS COMPLETE MODIFY
1 series · 14 of 25 positions shown · non-contrast
Comparison: none

CLINICAL DATA: Heavy bleeding. Last menstrual period was [REDACTED]. 
 TRANSABDOMINAL AND TRANSVAGINAL PELVIC ULTRASOUND:
TECHNIQUE: Both transabdominal and transvaginal ultrasound examinations of the pelvis were performed including evaluation of the uterus, ovaries, adnexal regions, and pelvic cul-de-sac.

[Series 1: us transvaginal non-ob · 14 of 40 slices shown]
[im 1/40]
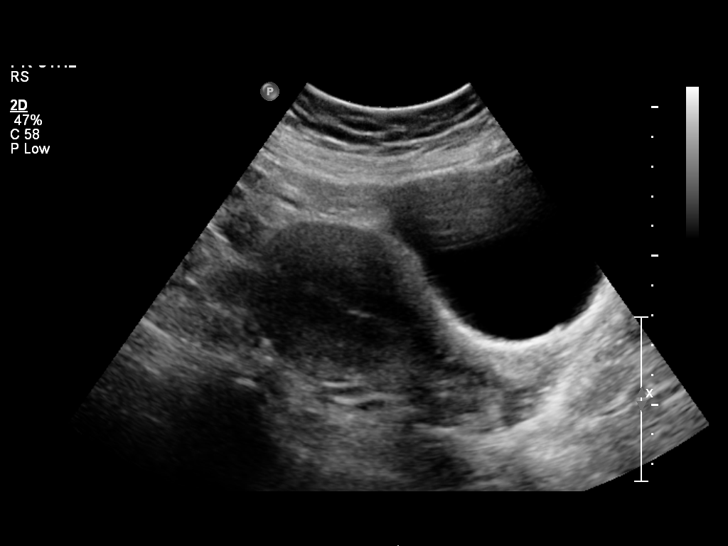
[im 4/40]
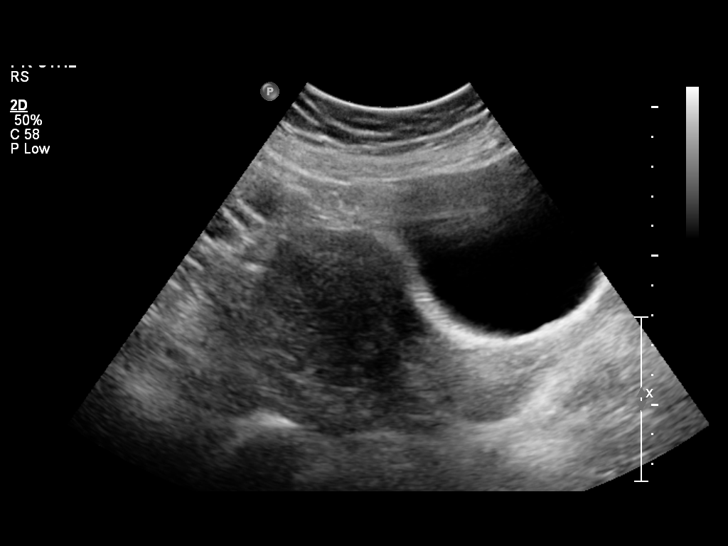
[im 7/40]
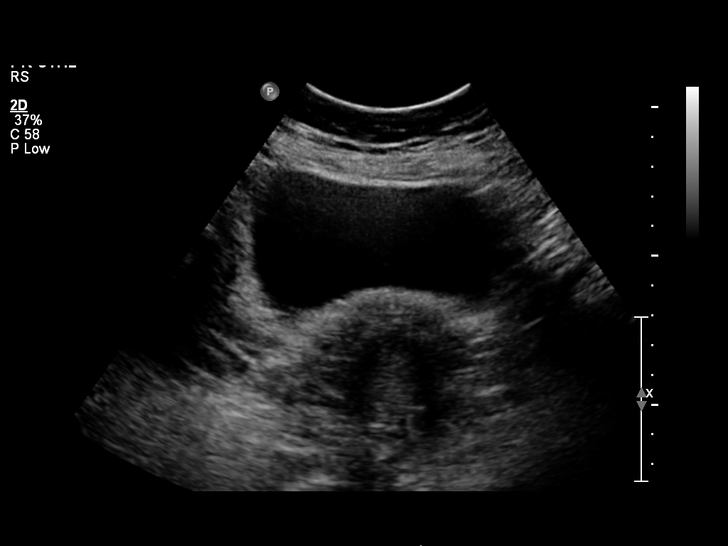
[im 10/40]
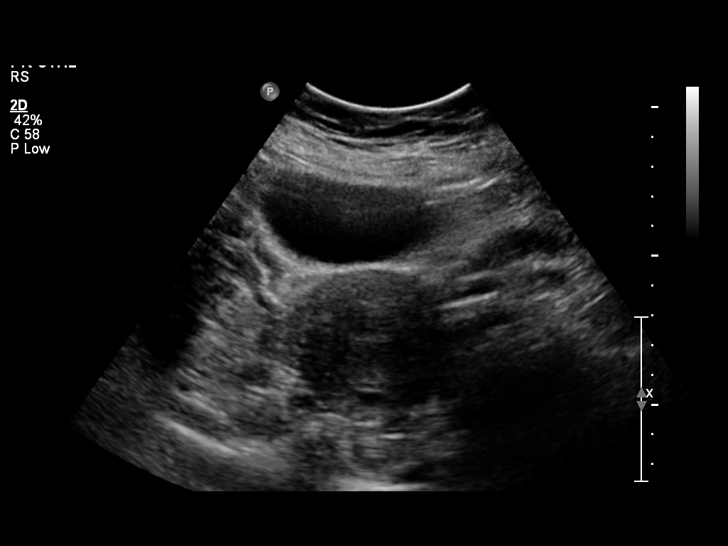
[im 14/40]
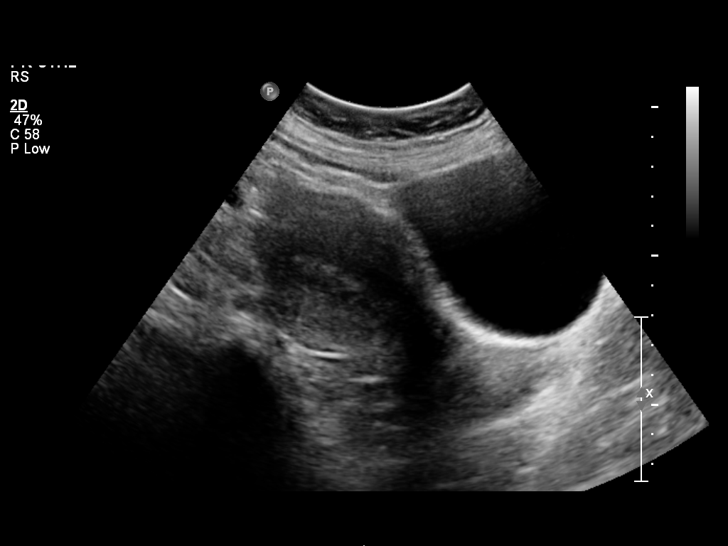
[im 15/40]
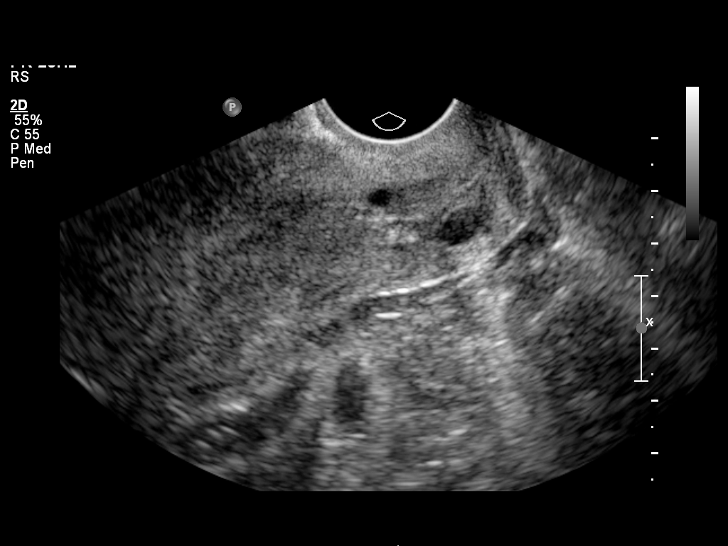
[im 18/40]
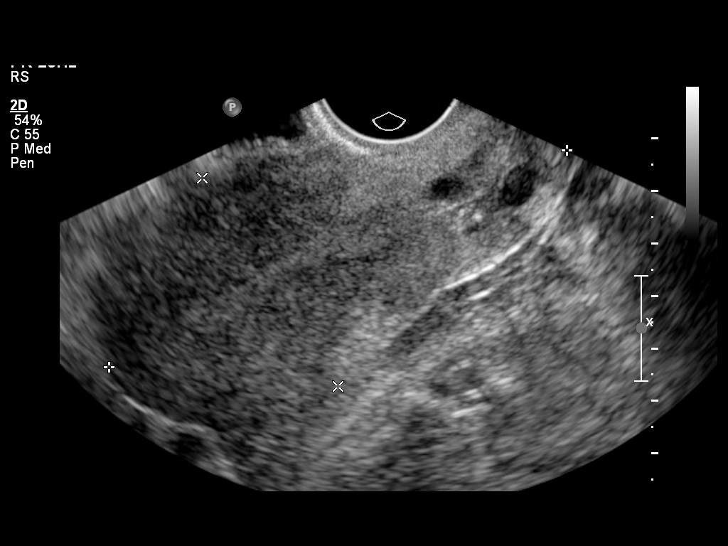
[im 22/40]
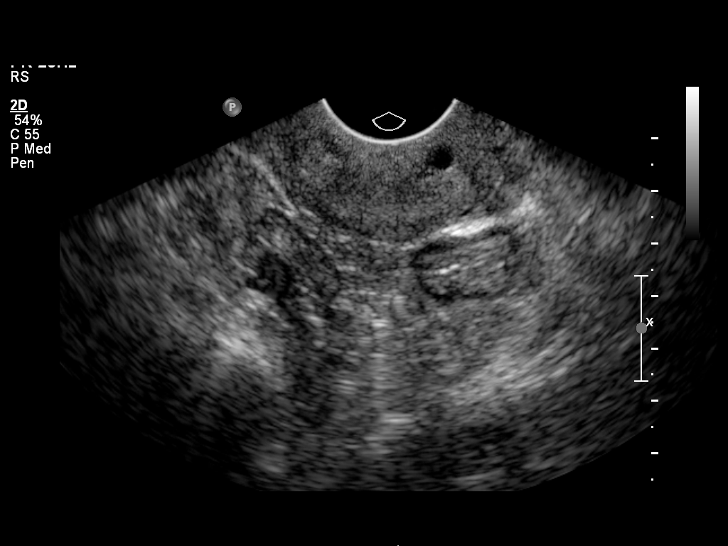
[im 25/40]
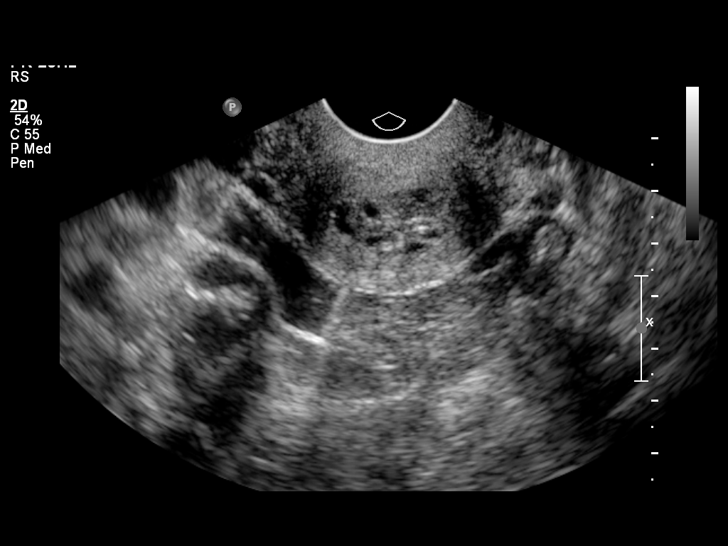
[im 27/40]
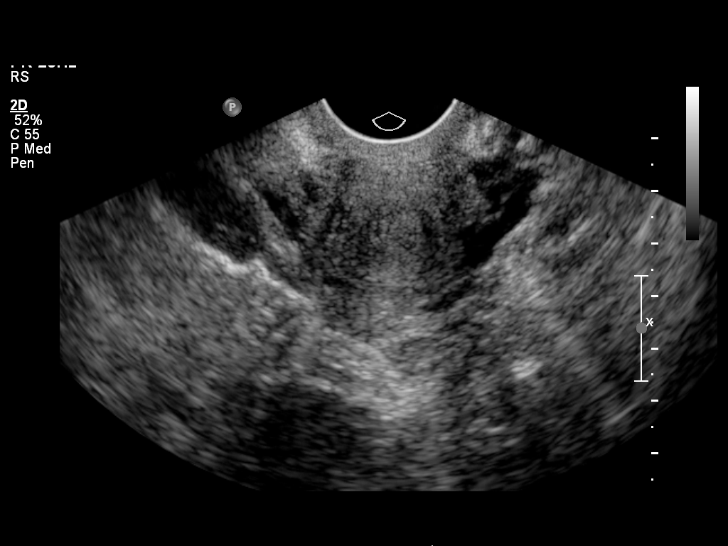
[im 30/40]
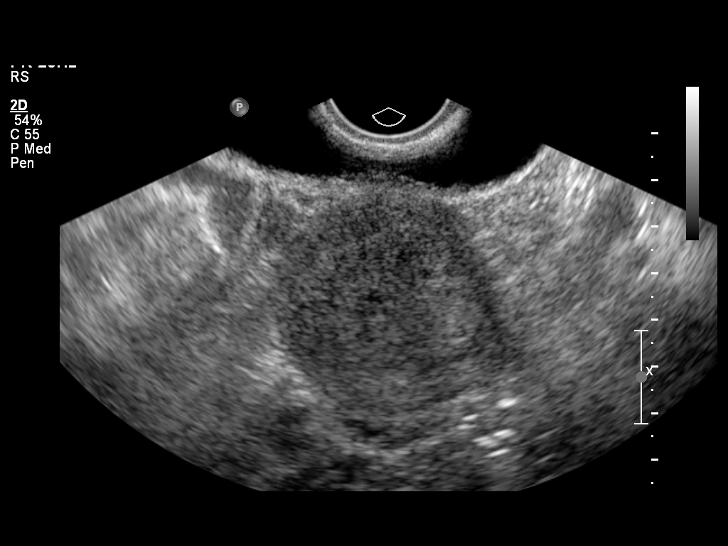
[im 33/40]
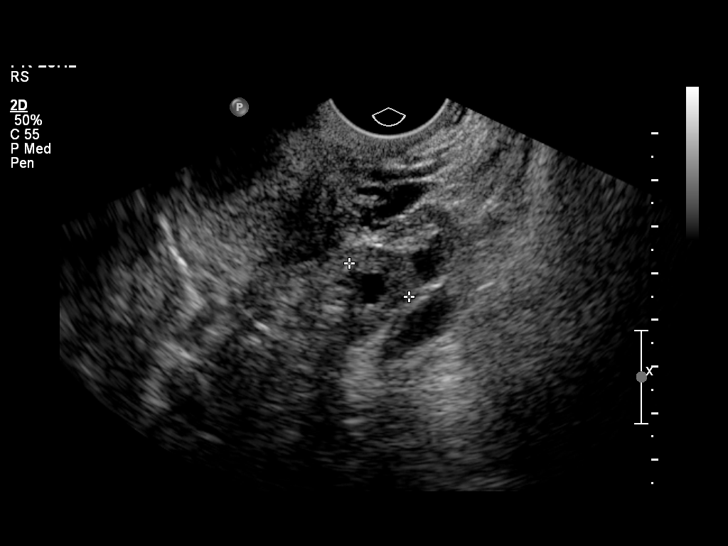
[im 36/40]
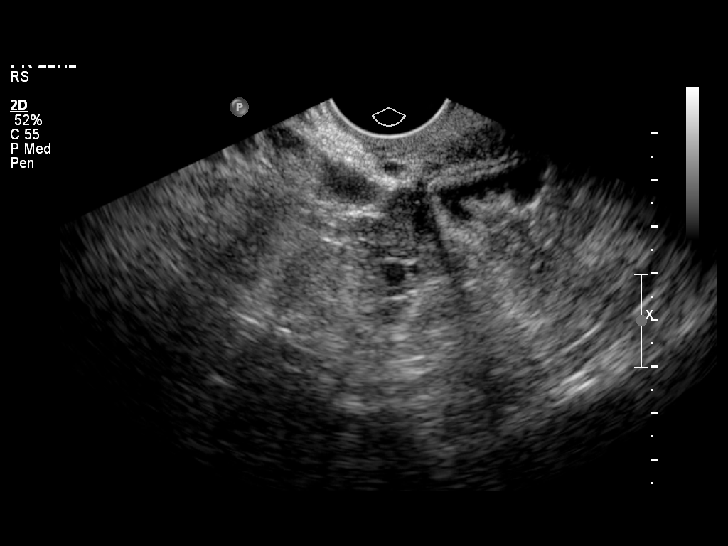
[im 40/40]
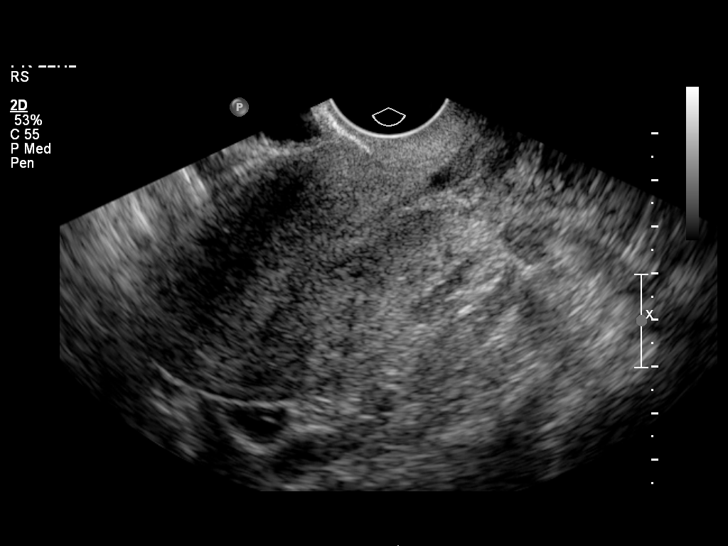

[14 of 25 positions shown; findings below may reference images not displayed]

FINDINGS: The uterus is normal in size and echogenicity measuring 10.4 x 5.5 x 5.0 cm. The endometrium is uniform in thickness measuring 5.7 mm. The right ovary is normal in echogenicity measuring 2.0 x 1.5 x 1.2 cm. The left ovary is normal in size and echogenicity measuring 2.7 x 1.8 x 1.5 cm. Trace amount of free fluid in the posterior cul-de-sac.
IMPRESSION: No acute pelvic process identified.

## 2009-08-28 ENCOUNTER — Emergency Department: Payer: Self-pay | Admitting: Emergency Medicine

## 2009-10-07 ENCOUNTER — Emergency Department: Payer: Self-pay

## 2010-01-26 ENCOUNTER — Emergency Department (HOSPITAL_COMMUNITY): Admission: EM | Admit: 2010-01-26 | Discharge: 2010-01-26 | Payer: Self-pay | Admitting: Emergency Medicine

## 2010-02-03 ENCOUNTER — Ambulatory Visit: Payer: Self-pay | Admitting: Nurse Practitioner

## 2010-02-03 DIAGNOSIS — Z780 Asymptomatic menopausal state: Secondary | ICD-10-CM | POA: Insufficient documentation

## 2010-02-03 LAB — CONVERTED CEMR LAB
Beta hcg, urine, semiquantitative: NEGATIVE
Bilirubin Urine: NEGATIVE
Blood in Urine, dipstick: NEGATIVE
FSH: 8.4 milliintl units/mL
Glucose, Urine, Semiquant: NEGATIVE
Ketones, urine, test strip: NEGATIVE
Nitrite: NEGATIVE
Specific Gravity, Urine: 1.015
Urobilinogen, UA: 0.2
WBC Urine, dipstick: NEGATIVE
pH: 8

## 2010-02-07 ENCOUNTER — Telehealth (INDEPENDENT_AMBULATORY_CARE_PROVIDER_SITE_OTHER): Payer: Self-pay | Admitting: Nurse Practitioner

## 2010-02-08 ENCOUNTER — Encounter (HOSPITAL_COMMUNITY)
Admission: RE | Admit: 2010-02-08 | Discharge: 2010-03-10 | Payer: Self-pay | Source: Home / Self Care | Admitting: Orthopaedic Surgery

## 2010-02-15 ENCOUNTER — Telehealth (INDEPENDENT_AMBULATORY_CARE_PROVIDER_SITE_OTHER): Payer: Self-pay | Admitting: Nurse Practitioner

## 2010-02-20 ENCOUNTER — Telehealth (INDEPENDENT_AMBULATORY_CARE_PROVIDER_SITE_OTHER): Payer: Self-pay | Admitting: Nurse Practitioner

## 2010-03-06 ENCOUNTER — Telehealth (INDEPENDENT_AMBULATORY_CARE_PROVIDER_SITE_OTHER): Payer: Self-pay | Admitting: Nurse Practitioner

## 2010-03-24 ENCOUNTER — Encounter (INDEPENDENT_AMBULATORY_CARE_PROVIDER_SITE_OTHER): Payer: Self-pay | Admitting: *Deleted

## 2010-03-27 ENCOUNTER — Ambulatory Visit: Payer: Self-pay | Admitting: Nurse Practitioner

## 2010-03-27 DIAGNOSIS — R300 Dysuria: Secondary | ICD-10-CM | POA: Insufficient documentation

## 2010-03-27 LAB — CONVERTED CEMR LAB
Bilirubin Urine: NEGATIVE
Blood in Urine, dipstick: NEGATIVE
Glucose, Urine, Semiquant: NEGATIVE
Nitrite: NEGATIVE
Protein, U semiquant: NEGATIVE
Specific Gravity, Urine: >=1.03
Urobilinogen, UA: 0.2
WBC Urine, dipstick: NEGATIVE
pH: 5.5

## 2010-04-10 ENCOUNTER — Telehealth (INDEPENDENT_AMBULATORY_CARE_PROVIDER_SITE_OTHER): Payer: Self-pay | Admitting: Nurse Practitioner

## 2010-04-21 ENCOUNTER — Encounter (INDEPENDENT_AMBULATORY_CARE_PROVIDER_SITE_OTHER): Payer: Self-pay | Admitting: Nurse Practitioner

## 2010-04-30 ENCOUNTER — Emergency Department (HOSPITAL_COMMUNITY): Admission: EM | Admit: 2010-04-30 | Discharge: 2010-04-30 | Payer: Self-pay | Admitting: Emergency Medicine

## 2010-05-10 ENCOUNTER — Telehealth (INDEPENDENT_AMBULATORY_CARE_PROVIDER_SITE_OTHER): Payer: Self-pay | Admitting: Nurse Practitioner

## 2010-05-30 ENCOUNTER — Other Ambulatory Visit
Admission: RE | Admit: 2010-05-30 | Discharge: 2010-05-30 | Payer: Self-pay | Source: Home / Self Care | Admitting: Nurse Practitioner

## 2010-05-30 ENCOUNTER — Ambulatory Visit: Payer: Self-pay | Admitting: Nurse Practitioner

## 2010-05-30 ENCOUNTER — Encounter (INDEPENDENT_AMBULATORY_CARE_PROVIDER_SITE_OTHER): Payer: Self-pay | Admitting: Nurse Practitioner

## 2010-05-30 DIAGNOSIS — E669 Obesity, unspecified: Secondary | ICD-10-CM | POA: Insufficient documentation

## 2010-05-30 LAB — CONVERTED CEMR LAB
ALT: 17 units/L (ref 0–35)
AST: 15 units/L (ref 0–37)
Albumin: 4.1 g/dL (ref 3.5–5.2)
Alkaline Phosphatase: 75 units/L (ref 39–117)
BUN: 15 mg/dL (ref 6–23)
Basophils Absolute: 0.1 10*3/uL (ref 0.0–0.1)
Basophils Relative: 1 % (ref 0–1)
Bilirubin Urine: NEGATIVE
Blood in Urine, dipstick: NEGATIVE
CO2: 28 meq/L (ref 19–32)
Calcium: 9.1 mg/dL (ref 8.4–10.5)
Chlamydia, DNA Probe: NEGATIVE
Chloride: 101 meq/L (ref 96–112)
Cholesterol: 153 mg/dL (ref 0–200)
Creatinine, Ser: 0.56 mg/dL (ref 0.40–1.20)
Eosinophils Absolute: 0.1 10*3/uL (ref 0.0–0.7)
Eosinophils Relative: 1 % (ref 0–5)
GC Probe Amp, Genital: NEGATIVE
Glucose, Bld: 76 mg/dL (ref 70–99)
Glucose, Urine, Semiquant: NEGATIVE
HCT: 38.3 % (ref 36.0–46.0)
HDL: 47 mg/dL (ref >39)
Hemoglobin: 12.1 g/dL (ref 12.0–15.0)
KOH Prep: NEGATIVE
Ketones, urine, test strip: NEGATIVE
LDL Cholesterol: 93 mg/dL (ref 0–99)
Lymphocytes Relative: 23 % (ref 12–46)
Lymphs Abs: 2.1 10*3/uL (ref 0.7–4.0)
MCHC: 31.6 g/dL (ref 30.0–36.0)
MCV: 80.6 fL (ref 78.0–100.0)
Monocytes Absolute: 0.8 10*3/uL (ref 0.1–1.0)
Monocytes Relative: 9 % (ref 3–12)
Neutro Abs: 6.2 10*3/uL (ref 1.7–7.7)
Neutrophils Relative %: 66 % (ref 43–77)
Nitrite: NEGATIVE
OCCULT 1: NEGATIVE
Platelets: 393 10*3/uL (ref 150–400)
Potassium: 4.2 meq/L (ref 3.5–5.3)
Protein, U semiquant: NEGATIVE
RBC: 4.75 M/uL (ref 3.87–5.11)
RDW: 16 % — ABNORMAL HIGH (ref 11.5–15.5)
Rapid HIV Screen: NEGATIVE
Sodium: 136 meq/L (ref 135–145)
Specific Gravity, Urine: 1.025
TSH: 2.063 microintl units/mL (ref 0.350–4.500)
Total Bilirubin: 0.4 mg/dL (ref 0.3–1.2)
Total CHOL/HDL Ratio: 3.3
Total Protein: 7.1 g/dL (ref 6.0–8.3)
Triglycerides: 65 mg/dL (ref <150)
Urobilinogen, UA: 0.2
VLDL: 13 mg/dL (ref 0–40)
WBC: 9.3 10*3/uL (ref 4.0–10.5)
pH: 6

## 2010-06-01 ENCOUNTER — Encounter (INDEPENDENT_AMBULATORY_CARE_PROVIDER_SITE_OTHER): Payer: Self-pay | Admitting: Nurse Practitioner

## 2010-06-02 ENCOUNTER — Telehealth (INDEPENDENT_AMBULATORY_CARE_PROVIDER_SITE_OTHER): Payer: Self-pay | Admitting: Nurse Practitioner

## 2010-06-02 ENCOUNTER — Encounter (INDEPENDENT_AMBULATORY_CARE_PROVIDER_SITE_OTHER): Payer: Self-pay | Admitting: Nurse Practitioner

## 2010-06-02 LAB — CONVERTED CEMR LAB: Pap Smear: NEGATIVE

## 2010-06-24 ENCOUNTER — Other Ambulatory Visit: Payer: Self-pay | Admitting: Internal Medicine

## 2010-06-24 DIAGNOSIS — Z1239 Encounter for other screening for malignant neoplasm of breast: Secondary | ICD-10-CM

## 2010-06-24 DIAGNOSIS — Z1231 Encounter for screening mammogram for malignant neoplasm of breast: Secondary | ICD-10-CM

## 2010-06-25 ENCOUNTER — Encounter: Payer: Self-pay | Admitting: Family Medicine

## 2010-07-04 NOTE — Letter (Signed)
Summary: *HSN Results Follow up  [facility]  [address] [place], [place] [address]   Phone: [phone]  Fax: [phone]      03/24/2010   Yong Channel [address] [place], [place]  [address]   Dear  Ms. Michiel Cowboy,                            ____S.Drinkard,FNP   ____D. Gore,FNP       ____B. McPherson,MD   ____V. Rankins,MD    ____E. Mulberry,MD    ____N. Daphine Deutscher, FNP  ____D. Reche Dixon, MD    ____K. Philipp Deputy, MD    ____Other     This letter is to inform you that your recent test(s):  _______Pap Smear    _______Lab Test     _______X-ray    _______ is within acceptable limits  _______ requires a medication change  _______ requires a follow-up lab visit  _______ requires a follow-up visit with your provider   Comments:  We have been trying to reach you at [phone].  Please contact the office at your earliest convenience.       _________________________________________________________ If you have any questions, please contact our office                     Sincerely,  Levon Hedger [facility]

## 2010-07-04 NOTE — Letter (Signed)
Summary: *HSN Results Follow up  [facility]  [address] [place], [place] [address]   Phone: [phone]  Fax: [phone]      03/24/2008   SAMYAH BILBO [address] [place], [place]  [address]   Dear  Ms. Michiel Cowboy,                            ____S.Drinkard,FNP   ____D. Gore,FNP       ____B. McPherson,MD   _X___V. Yamilee Harmes,MD    ____E. Mulberry,MD    ____N. Daphine Deutscher, FNP  ____D. Reche Dixon, MD    ____K. Philipp Deputy, MD    ____Other     This letter is to inform you that your recent test(s):  ___X____Pap Smear    ____X___Lab Test     _______X-ray    ____X___ is within acceptable limits  _______ requires a medication change  _______ requires a follow-up lab visit  _______ requires a follow-up visit with your provider   Comments:  Your PAP result and labs were all normal.       _________________________________________________________ If you have any questions, please contact our office                     Sincerely,  Beverley Fiedler MD [facility]

## 2010-07-04 NOTE — Progress Notes (Signed)
Summary: lab results  Phone Note Call from Patient Call back at [facility] Phone [phone]   Summary of Call: The pt needs her results back. Please call her back  [facility] FNP Initial call taken by: Manon Hilding,  June 06, 2009 11:47 AM  Follow-up for Phone Call        labs done during recent office visit were ok except that pt was just slightly anemic. she should continue Multivitamin daily as she should currently be taking PAP results are not back yet. she will recieve a letter if results ok Follow-up by: Lehman Prom FNP,  June 06, 2009 3:06 PM  Additional Follow-up for Phone Call Additional follow up Details #1::        pt informed. Additional Follow-up by: Levon Hedger,  June 06, 2009 5:23 PM

## 2010-07-04 NOTE — Letter (Signed)
Summary: GUNECOLOGIC CYTOLOGY REPORT  GUNECOLOGIC CYTOLOGY REPORT   Imported By: Arta Bruce 07/27/2009 11:58:53  _____________________________________________________________________  External Attachment:    Type:   Image     Comment:   External Document

## 2010-07-04 NOTE — Progress Notes (Signed)
  Phone Note Call from Patient Call back at Home Phone [phone]   Caller: Patient Summary of Call: She is concerned because on February 16, 2007 she had a brown color discharge that looks like a menstrual period and then went away but it came back again two weeks later but this time was a regular(red color)  period.  She is 45 years old and doesn't know if is because of the menopause.  Please call her back.  Dr Barbaraann Barthel Initial call taken by: Manon Hilding,  March 06, 2007 11:58 AM  Follow-up for Phone Call        left message on answer machine to return call  Follow-up by: Gaylyn Cheers RN,  March 10, 2007 1:11 PM  Additional Follow-up for Phone Call Additional follow up Details #1::        left message on answer machine to return call  Additional Follow-up by: Gaylyn Cheers RN,  March 11, 2007 9:38 AM    Additional Follow-up for Phone Call Additional follow up Details #2::    Spoke to pt and reassured her.  She is feeling better.  Still has questions and wants to see Dr. Barbaraann Barthel.  She will call end of next week to check schedule for next week Follow-up by: Vesta Mixer CMA,  March 11, 2007 3:02 PM

## 2010-07-04 NOTE — Progress Notes (Signed)
  Phone Note Call from Patient   Caller: Patient Complaint: Urinary/GYN Problems Summary of Call: pt concerned she had her period for one day and then started spotting brown discharge. No complaints of abd pain, no foul smelling d/c, no fever, no itching. Explained to pt if any of those problems do occur she needs to be seen, had recent pap. Discussed perimenapausal symptoms which may be what she is experiencing. Initial call taken by: Gaylyn Cheers RN,  February 19, 2007 9:20 AM
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
## 2010-07-04 NOTE — Letter (Signed)
Summary: Handout Printed  Printed Handout:  - Diet - High-Fiber 

## 2010-07-04 NOTE — Progress Notes (Signed)
Summary: Contraception  Phone Note Outgoing Call   Summary of Call: Received Rx for Plan B Refill - indicates that it was written on 04/21/2010 (must have been from another provider because i did not write it) See previous notes long phone notes and converstations during pt's visits about contraception. Plan B is NOT a birth control method.  Will not refill.  If she is interested in NOT being pregnant then she needs to consider a long term method of birth control given her age such as tubal ligation or IUD.  given her age the risk for hormone therapy is increased for side effects - pills or depoprovera.  But pt needs to decide on SOME method ANY method.  Because it is clear that she is having unprotected sex or she would not keep requesting pregancy tests and now plan B.  I have also reviewed with pt that condom use is also an option and this helps to preven pregnancy and STD.  She indicated that her partner did not want to use condoms, so there again there is a chance that she takes.  If she is interested in her health then she should do whatever possible to protect herself. Initial call taken by: Lehman Prom FNP,  May 10, 2010 10:04 AM  Follow-up for Phone Call        Left message on answer machine for pt. to return call. Gaylyn Cheers RN  May 11, 2010 9:30 AM     Additional Follow-up for Phone Call Additional follow up Details #1::        pt returning call. advised pt will have [facility] call back .Marland KitchenMarland KitchenMarland KitchenWashington County Hospital Stanislawscyk  May 12, 2010 8:45 AM    Additional Follow-up for Phone Call Additional follow up Details #2::    discused info per N. Martin's note, pt. states [facility] and [facility] gave her Micronor does not know the dose. Advised to bring medication with her to next appt. She plans to have [facility] as needed. Stressed that it will not prevent STD she needs to use a condom. Reviewed poss side effects and she understands to call if  has problems. Follow-up by: Gaylyn Cheers RN,  May 12, 2010 9:27 AM

## 2010-07-04 NOTE — Progress Notes (Signed)
  Phone Note Call from Patient Call back at Home Phone [phone]   Caller: Patient Reason for Call: Lab or Test Results Summary of Call: The patient is requesting for her lab results. Dr. Barbaraann Barthel Initial call taken by: Manon Hilding,  March 12, 2008 4:22 PM  Follow-up for Phone Call        Sigh...see my note and patient instructions about not calling the office as I would send letter within 3 weeks...just re-iterate for her to wait on letter. Follow-up by: Beverley Fiedler MD,  March 13, 2008 1:09 PM  Additional Follow-up for Phone Call Additional follow up Details #1::        left message to return call. Additional Follow-up by: Mikey College CMA,  March 15, 2008 9:48 AM    Additional Follow-up for Phone Call Additional follow up Details #2::    spoke with [facility] and told her that we would be sending her aletter if all labs are normal and if something is wrong, we would call her. Follow-up by: Leodis Rains,  March 15, 2008 11:01 AM

## 2010-07-04 NOTE — Progress Notes (Signed)
Summary: Office Visit/DEPRESSION SCREENING  Office Visit/DEPRESSION SCREENING   Imported By: Arta Bruce 08/15/2009 17:01:21  _____________________________________________________________________  External Attachment:    Type:   Image     Comment:   External Document

## 2010-07-04 NOTE — Progress Notes (Signed)
Summary: Mammogram exam resutls  Phone Note Call from Patient Call back at Home Phone [phone]   Summary of Call: Unfortunately, the pt missed her office visit today because her car broke down and she is wondering if a medical assistant can call her and let her know about her mammogram exam. Rankins MD Initial call taken by: Manon Hilding,  December 10, 2008 12:31 PM  Follow-up for Phone Call        forward to provider Follow-up by: [place] Shannon,  December 14, 2008 2:50 PM  Additional Follow-up for Phone Call Additional follow up Details #1::        [facility] the [facility]--did they not send them to her? The abnormality they saw in her breast does not look like cancer--but because it is a change--they would like to follow her up in 6 months with mammo and ultrasound--please notify pt. and check with [facility] to make sure that has been set up. Additional Follow-up by: Julieanne Manson MD,  December 15, 2008 9:40 AM    Additional Follow-up for Phone Call Additional follow up Details #2::    Left message on answering machine for pt to call back...Marland KitchenMarland KitchenArmenia Shannon  December 15, 2008 3:17 PM   spoke with pt and informed her about mammo results..........Marland KitchenArmenia Shannon  December 16, 2008 11:42 AM

## 2010-07-04 NOTE — Progress Notes (Signed)
Summary: DEPO SHOT QUESTION  Phone Note Call from Patient Call back at [phone]   Summary of Call: PATIENT WANT DEPO SHOT SHE HAS QUESTION ABOUT THE DEPO. PLEASE CALL HER AT  Initial call taken by: Domenic Polite,  April 10, 2010 11:52 AM  Follow-up for Phone Call        Pt. wants to know if she would be a good candidate for the depo shots, in light of her age, does not want a pregnancy.  Has become sexually active again; went to the ED because she was having some bleeding when urinating, was put on an antibiotic.  Wants to come in for depo shot and STD testing as soon as possible.  Advised re safe sex and use of condoms, given information on STDs and transmission.   Follow-up by: Dutch Quint RN,  April 10, 2010 3:14 PM  Additional Follow-up for Phone Call Additional follow up Details #1::        pt has been see REPEATEDLY for STD testing and family planning. She is over 35 so no method is going to be best when it comes to hormones.  All put her at increase risk for CVA or blood clots. She was started on birth control on 03/27/2010 - did she start taking? Also review with that neither pills or depoprovera is going to shield her from STD's.  Can't continue to check pt for STDs monthly - I have advised condom use and sensibility.  She states that her partners doesn't want to use condoms but is that worth the sacrifice to her health.  all this paranoria about STD and pregnancy has seemed to re-start at onset of this relationship. Would pt consider tubal ligation - i see that she has medicaid perhaps that will be an option Additional Follow-up by: Lehman Prom FNP,  April 10, 2010 3:27 PM    Additional Follow-up for Phone Call Additional follow up Details #2::    Left message on answering machine for pt. to return call.  Dutch Quint RN  April 10, 2010 3:47 PM  Is not interested in tubal ligation -- does not want permanent method.  Has decided to try the [facility] pills -- has  not up until now.  Discussed use and also use of condoms for overall good health maintenance. Follow-up by: Dutch Quint RN,  April 11, 2010 4:38 PM

## 2010-07-04 NOTE — Letter (Signed)
Summary: Handout Printed  Printed Handout:  - Perimenopause 

## 2010-07-04 NOTE — Assessment & Plan Note (Signed)
Summary: Dysuria   Vital Signs:  Patient profile:   45 year old female Menstrual status:  regular LMP:     03/24/2010 Height:      61 inches Weight:      136.1 pounds BMI:     25.81 Temp:     97.0 degrees F oral Pulse rate:   100 / minute Pulse rhythm:   regular Resp:     18 per minute BP sitting:   110 / 62  (right arm) Cuff size:   regular  Vitals Entered By: Michelle Nasuti (March 27, 2010 2:37 PM)  Nutrition Counseling: Patient's BMI is greater than 25 and therefore counseled on weight management options. CC: pt c/o burning with urination increase in frequency. new relationship so pt would like something for birth control. concerns with oral sex and safe sex practice, Depression  Does patient need assistance? Ambulation Normal LMP (date): 03/24/2010 LMP - Character: abnormal    Menses interval (days): 28 Menstrual flow (days): 4-5 Enter LMP: 03/24/2010 Last PAP Result  Specimen Adequacy: Satisfactory for evaluation.   Interpretation/Result:Negative for intraepithelial Lesion or Malignancy.      CC:  pt c/o burning with urination increase in frequency. new relationship so pt would like something for birth control. concerns with oral sex and safe sex practice and Depression.  History of Present Illness:  Pt into the office with complaint of vaginal irrigation  Burning with urination Some clear discharge No blood noted in urine Menses is on at this time (LMP 03/24/2010 and is on at this time) no abdominal cramping  pt also has some questions about STD's and sex safe practices. Pt as been counselled several times before about condom use to prevent transmission of STD. Pt admits that her current partner does not want to use condoms. Pt has lots of questions versus viruses and bacteria pt report that she is very monogomous but her fear is that her current partner is not or rather that she does know what he is doing. Pt has question about birth control.  She used to  use diaphragm.  Pt admits that in her current relationship she may want to have another child but wants to know if she can start on birth control The patient denies that she feels like life is not worth living, denies that she wishes that she were dead, and denies that she has thought about ending her life.         Depression Treatment History:  Prior Medication Used:   Start Date: Assessment of Effect:   Comments:  lexapro     01/24/2009   started     --   Allergies: No Known Drug Allergies  Review of Systems CV:  Denies chest pain or discomfort. Resp:  Denies cough. GI:  Denies abdominal pain. GU:  Complains of discharge, dysuria, and urinary frequency; denies incontinence; clear discharge.  Physical Exam  General:  alert.   Head:  normocephalic.   Lungs:  normal breath sounds.   Heart:  normal rate and regular rhythm.   Abdomen:  normal bowel sounds.   Neurologic:  alert & oriented X3.     Impression & Recommendations:  Problem # 1:  DYSURIA (ICD-788.1) will send urine for culture blood noted but pt is no her menses Orders: UA Dipstick w/o Micro (automated)  (81003)  Problem # 2:  ANXIETY (ICD-300.00) ongoing education with pt  Problem # 3:  FAMILY PLANNING (ICD-V25.09) advised pt that oral contraception will not protect her  from STD she would like to take oral contraception to prevent pregnancy  no tobacco use, no dvt, no CVA advance age  Problem # 4:  NEED PROPHYLACTIC VACCINATION&INOCULATION FLU (ICD-V04.81) given today in office  Complete Medication List: 1)  Multivitamins Tabs (Multiple vitamin) 2)  Calcium 600-200 Mg-unit Tabs (Calcium-vitamin d) .... Take 1 tablet daily 3)  Metrogel 1 % Gel (Metronidazole) .... Apply to face daily 4)  Ortho-cept (28) 0.15-30 Mg-mcg Tabs (Desogestrel-ethinyl estradiol) .... One tablet by mouth daily  Other Orders: Flu Vaccine 69yrs + (16109) Admin 1st Vaccine (60454)  Patient Instructions: 1)  Flu vaccine given  today. 2)  Schedule a complete physical exam in December (1 year after your last physical) 3)  Remember that pills will only protect you against pregnanacy.  To prevent viruses or STD you should use condoms 4)  Start birth control pills.  Remember a risk factor is that you are over 35.   5)  You should start the pills on this coming SUNDAY as your period should be off by then.  Take medications at the same time daily. 6)  Urine - blood noted but this may be due to period. 7)  Advise hygiene - white cotton underwear, wipe from front to back.  drink plenty of water Prescriptions: ORTHO-CEPT (28) 0.15-30 MG-MCG TABS (DESOGESTREL-ETHINYL ESTRADIOL) One tablet by mouth daily  #1 package x 3   Entered and Authorized by:   Lehman Prom FNP   Signed by:   Lehman Prom FNP on 03/27/2010   Method used:   Print then Give to Patient   RxID:   [id number]    Orders Added: 1)  Est. Patient Level III [30865] 2)  UA Dipstick w/o Micro (automated)  [81003] 3)  Flu Vaccine 35yrs + [78469] 4)  Admin 1st Vaccine [62952]   Immunizations Administered:  Influenza Vaccine # 1:    Vaccine Type: Fluvax 3+    Site: left deltoid    Mfr: GlaxoSmithKline    Dose: 0.5 ml    Route: IM    Given by: Armenia Shannon    Exp. Date: 12/02/2010    Lot #: [id number]    VIS given: 12/27/09 version given March 27, 2010.  Flu Vaccine Consent Questions:    Do you have a history of severe allergic reactions to this vaccine? no    Any prior history of allergic reactions to egg and/or gelatin? no    Do you have a sensitivity to the preservative Thimersol? no    Do you have a past history of Guillan-Barre Syndrome? no    Do you currently have an acute febrile illness? no    Have you ever had a severe reaction to latex? no    Vaccine information given and explained to patient? yes    Are you currently pregnant? no   Immunizations Administered:  Influenza Vaccine # 1:    Vaccine Type: Fluvax 3+    Site:  left deltoid    Mfr: GlaxoSmithKline    Dose: 0.5 ml    Route: IM    Given by: Armenia Shannon    Exp. Date: 12/02/2010    Lot #: [id number]    VIS given: 12/27/09 version given March 27, 2010.  Prevention & Chronic Care Immunizations   Influenza vaccine: Fluvax 3+  (03/27/2010)    Tetanus booster: 06/05/2006: historical per pt    Pneumococcal vaccine: Not documented  Other Screening   Pap smear:  Specimen Adequacy: Satisfactory for evaluation.   Interpretation/Result:Negative  for intraepithelial Lesion or Malignancy.     (06/02/2009)   Pap smear due: 06/2010    Mammogram: BI-RADS CATEGORY 1:  Negative.^MM DIGITAL DIAGNOSTIC UNILAT L  (06/09/2009)   Smoking status: Not documented  Lipids   Total Cholesterol: 159  (06/02/2009)   LDL: 83  (06/02/2009)   LDL Direct: Not documented   HDL: 62  (06/02/2009)   Triglycerides: 70  (06/02/2009)   Nursing Instructions: Give Flu vaccine today    Laboratory Results   Urine Tests  Date/Time Received: March 27, 2010 3:14 PM   Routine Urinalysis   Color: yellow Glucose: negative   (Normal Range: Negative) Bilirubin: negative   (Normal Range: Negative) Ketone: moderate (40)   (Normal Range: Negative) Spec. Gravity: >=1.030   (Normal Range: 1.003-1.035) Blood: negative   (Normal Range: Negative) pH: 5.5   (Normal Range: 5.0-8.0) Protein: negative   (Normal Range: Negative) Urobilinogen: 0.2   (Normal Range: 0-1) Nitrite: negative   (Normal Range: Negative) Leukocyte Esterace: negative   (Normal Range: Negative)    Comments: Pt is currently on her menses

## 2010-07-04 NOTE — Progress Notes (Signed)
Summary: HAVING A LOT OF SPOTTING  Phone Note Call from Patient Call back at Home Phone [phone]   Reason for Call: Talk to Nurse Summary of Call: MARTIN PT. MS Wingler SAYS THAT SHE IS STILL SPOTTING AND ITS BEEN GOING ON FOR ABOUT A MONTH. Initial call taken by: Leodis Rains,  February 20, 2010 3:14 PM  Follow-up for Phone Call        Only spotting, nothing heavy, "more annoying than anything else."  Spotting comes and goes, no major change since last phone note, just still spotting.  Is concerned that this is not normal; wants to know if she needs to be seen or to do anything else.  Advised that perimenopause is not a straight course, that spotting on and off is not unusual, but that her provider would be notified.  Advised that she'll be notified if she needs to be seen or other treatment is indicated. Follow-up by: Dutch Quint RN,  February 20, 2010 3:57 PM  Additional Follow-up for Phone Call Additional follow up Details #1::        reviewed with pt during last visit that she may have some spotting with perimenopause.  no need for an earlier appt unless symptoms change Additional Follow-up by: Lehman Prom FNP,  February 20, 2010 4:18 PM    Additional Follow-up for Phone Call Additional follow up Details #2::    Left message on answering machine for pt. to return call.  Dutch Quint RN  February 20, 2010 4:26 PM  Advised pt. of provider's response -- verbalized understanding.  Dutch Quint RN  February 21, 2010 10:12 AM

## 2010-07-04 NOTE — Progress Notes (Signed)
Summary: Abnormal menses  Phone Note Call from Crystal Gross Call back at [phone]   Caller: Crystal Gross Reason for Call: Acute Illness Summary of Call: PT IS STILL HAVING HER MENSTURAL FOR ABOUT  A MONTH SHE IS ALITTLE CONCERN, VERY LIGHT AND SPOTTING, DIZZY LIGHT HEADED  Initial call taken by: Oscar La,  February 15, 2010 3:05 PM  Follow-up for Phone Call        Got a period nine days early (8/22) and has been on and off ever since.  Now, it's spotting, using about two pads a day for the past 4 days.  Wanted to know if this is normal.  I referred her back to the phone note of 02/07/10 where it was explained to her that she is perimenopausal and that this could happen.  We discussed the symptoms and how irregular her menses could get.  She states that the light-headedness could be from the heat.  She will call back if her menses becomes heavy and stays that way.  Dutch Quint RN  February 15, 2010 3:33 PM

## 2010-07-04 NOTE — Letter (Signed)
Summary: Handout Printed  Printed Handout:  - Rosacea 

## 2010-07-04 NOTE — Progress Notes (Signed)
Summary: Lab results  Phone Note Outgoing Call   Summary of Call: notify pt that lab is just as we discussed. it is normal which i expected because she still has a cycle but it is irregular.  So she she is still making some hormones.  she is PERImenopausal which means she may still have some spotting and irregular menses. Refer pt to handout given during recent visit she will be POSTmenopausal when she does not see her cycle for a full year.  there is no way to tell when this will be. Initial call taken by: Lehman Prom FNP,  February 07, 2010 8:13 AM  Follow-up for Phone Call        pt informed ot above information. Follow-up by: Levon Hedger,  February 07, 2010 10:36 AM
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
## 2010-07-04 NOTE — Letter (Signed)
Summary: TEST ORDER/MAMMOGRAM  TEST ORDER/MAMMOGRAM   Imported By: Arta Bruce 12/02/2008 12:31:34  _____________________________________________________________________  External Attachment:    Type:   Image     Comment:   External Document

## 2010-07-04 NOTE — Letter (Signed)
Summary: triage call  triage call   Imported By: Arta Bruce 03/07/2007 11:50:52  _____________________________________________________________________  External Attachment:    Type:   Image     Comment:   External Document
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
## 2010-07-04 NOTE — Letter (Signed)
Summary: [facility]   Imported By: Arta Bruce 05/02/2010 09:31:28  _____________________________________________________________________  External Attachment:    Type:   Image     Comment:   External Document

## 2010-07-04 NOTE — Progress Notes (Signed)
Summary: referral to an orthropedic  Phone Note Call from Patient Call back at [phone]   Caller: Patient Reason for Call: Talk to Nurse Summary of Call: pt needs a referral to dr[facility] [phone] Initial call taken by: Oscar La,  March 06, 2010 3:01 PM  Follow-up for Phone Call        Left message on answering machine for pt to call back.... [place] Shannon  March 20, 2010 9:25 AM  Levon Hedger  March 23, 2010 2:52 PM left message on machine for pt to return call to the office.  Levon Hedger  March 24, 2010 10:50 AM Left message on machine for pt to return call to the office.  Will mail letter.

## 2010-07-04 NOTE — Progress Notes (Signed)
Summary: wants pap and mamogram results  Phone Note Call from Patient Call back at Home Phone [phone]   Reason for Call: Lab or Test Results Summary of Call: nykedtra pt. ms Strine would like to know what her pap results are and her mamogram results Initial call taken by: Leodis Rains,  June 10, 2009 4:39 PM  Follow-up for Phone Call        forward to N. Daphine Deutscher, FNP Follow-up by: Levon Hedger,  June 10, 2009 5:01 PM  Additional Follow-up for Phone Call Additional follow up Details #1::        she should be contacted directly from the breast center regarding her mammogram PAP - done 06/02/2009 - call cytology to see where the results are?   Not in EMR Additional Follow-up by: Lehman Prom FNP,  June 10, 2009 5:07 PM    Additional Follow-up for Phone Call Additional follow up Details #2::    Pap smear is ok.  Will repeat in 1 year Follow-up by: Lehman Prom FNP,  June 13, 2009 4:33 PM  Additional Follow-up for Phone Call Additional follow up Details #3:: Details for Additional Follow-up Action Taken: Pt. notified of pap results had received mammo results already for the [facility] Additional Follow-up by: Gaylyn Cheers RN,  June 14, 2009 10:25 AM    Pap Smear  Procedure date:  06/02/2009  Findings:       Specimen Adequacy: Satisfactory for evaluation.   Interpretation/Result:Negative for intraepithelial Lesion or Malignancy.     Comments:      Repeat Pap in 1 year.    Procedures Next Due Date:    Pap Smear: 06/2010   Pap Smear  Procedure date:  06/02/2009  Findings:       Specimen Adequacy: Satisfactory for evaluation.   Interpretation/Result:Negative for intraepithelial Lesion or Malignancy.     Comments:      Repeat Pap in 1 year.    Procedures Next Due Date:    Pap Smear: 06/2010

## 2010-07-04 NOTE — Assessment & Plan Note (Signed)
Summary: Irregular Menstrual cycle   Vital Signs:  Patient profile:   45 year old female Menstrual status:  regular LMP:     01/23/2010 Weight:      145.0 pounds BMI:     27.50 Temp:     98.0 degrees F oral Pulse rate:   72 / minute Pulse rhythm:   regular Resp:     20 per minute BP sitting:   108 / 70  (left arm) Cuff size:   regular  Vitals Entered By: Levon Hedger (February 03, 2010 10:46 AM)  Nutrition Counseling: Patient's BMI is greater than 25 and therefore counseled on weight management options. CC: irregular periods had last cycle 8/22 it lasted half a day then spotting the returned on 8/30 with spotting that wont't stop Is Patient Diabetic? No Pain Assessment Patient in pain? no       Does patient need assistance? Functional Status Self care Ambulation Normal LMP (date): 01/23/2010 LMP - Character: abnormal    Menses interval (days): 28 Menstrual flow (days): 4-5 Enter LMP: 01/23/2010 Last PAP Result  Specimen Adequacy: Satisfactory for evaluation.   Interpretation/Result:Negative for intraepithelial Lesion or Malignancy.                                                                                                                                    CC:  irregular periods had last cycle 8/22 it lasted half a day then spotting the returned on 8/30 with spotting that wont't stop.  History of Present Illness:  Pt into the office with c/o menstrual irregularies LMP on August 22nd and that 10 days early.  Lasted for 1 day then stopped. On August the 30th the menses returned for 1 day then she started spotting.   Very little spotting at this time. -hot flashes -night sweats +trouble sleeping -mood irritability slight breast tenderness Denies any nausea and vomiting Usually only has light cramping with per periods Mother was 40 years when she stopped having her cycle  Allergies (verified): No Known Drug Allergies  Review of Systems General:   Complains of sleep disorder; denies sweats. CV:  Denies chest pain or discomfort. Resp:  Denies cough. GI:  Denies abdominal pain, nausea, and vomiting.  Physical Exam  General:  alert.   Head:  normocephalic.   Lungs:  normal breath sounds.   Heart:  normal rate and regular rhythm.   Genitalia:  self wet prep Msk:  normal ROM.   Neurologic:  alert & oriented X3.     Impression & Recommendations:  Problem # 1:  IRREGULAR MENSTRUAL CYCLE (ICD-626.4) advised pt that she is likely perimenopausal will check labs today per pt request Orders: UA Dipstick w/o Micro (manual) ([address]) KOH/ WET Mount [phone]) Urine Pregnancy Test  [id number]) T-FSH ([id number])  Problem # 2:  PERIMENOPAUSAL STATUS (ICD-V49.81) handout given  Complete Medication List: 1)  Multivitamins Tabs (Multiple vitamin) 2)  Calcium 600-200 Mg-unit  Tabs (Calcium-vitamin d) .... Take 1 tablet daily 3)  Metrogel 1 % Gel (Metronidazole) .... Apply to face daily  Patient Instructions: 1)  You are most likely perimenopausal. 2)  Read handout 3)  Follow up as needed  Laboratory Results   Urine Tests  Date/Time Received: February 03, 2010 12:13 PM   Routine Urinalysis   Color: lt. yellow Appearance: Clear Glucose: negative   (Normal Range: Negative) Bilirubin: negative   (Normal Range: Negative) Ketone: negative   (Normal Range: Negative) Spec. Gravity: 1.015   (Normal Range: 1.003-1.035) Blood: negative   (Normal Range: Negative) pH: 8.0   (Normal Range: 5.0-8.0) Protein: trace   (Normal Range: Negative) Urobilinogen: 0.2   (Normal Range: 0-1) Nitrite: negative   (Normal Range: Negative) Leukocyte Esterace: negative   (Normal Range: Negative)    Urine HCG: negative

## 2010-07-06 NOTE — Assessment & Plan Note (Signed)
Summary: Complete Physical Exam   Vital Signs:  Patient profile:   45 year old female Menstrual status:  regular LMP:     05/23/2010 Weight:      133.1 pounds BMI:     25.24 Temp:     97.5 degrees F oral Pulse rate:   96 / minute Pulse rhythm:   regular Resp:     20 per minute BP sitting:   90 / 60  (left arm) Cuff size:   regular  Vitals Entered By: Levon Hedger (May 30, 2010 11:10 AM)  Nutrition Counseling: Patient's BMI is greater than 25 and therefore counseled on weight management options. CC: Abdominal Pain Is Patient Diabetic? No Pain Assessment Patient in pain? no       Does patient need assistance? Functional Status Self care Ambulation Normal LMP (date): 05/23/2010 LMP - Character: light    Menses interval (days): 28 Menstrual flow (days): 4-5 Menstrual Status regular Enter LMP: 05/23/2010 Last PAP Result  Specimen Adequacy: Satisfactory for evaluation.   Interpretation/Result:Negative for intraepithelial Lesion or Malignancy.      CC:  Abdominal Pain.  History of Present Illness:  Pt into the office for complete physical exam  PAP- done last year in this office. No history of abnormal pap Pt was started on micronor by [facility]. She has been taking for about 1.5 months Pt is in a relationship and is planning to marry her current partner.  Tried condom use but that failed.  Mammogram - last mammogram was 1 year ago.  No family history of breast cancer  Optho - wears glasses and contacts.  last eye exam 09/2009  Dental - pt has an appt in 08/2009 for routine cleaning  Dyspepsia History:      She has no alarm features of dyspepsia including no history of melena, hematochezia, dysphagia, persistent vomiting, or involuntary weight loss > 5%.  There is a prior history of GERD.  The patient does not have a prior history of documented ulcer disease.  The dominant symptom is not heartburn or acid reflux.  An H-2 blocker medication is not currently  being taken.  She has no history of a positive H. Pylori serology.  No previous upper endoscopy has been done.    Habits & Providers  Alcohol-Tobacco-Diet     Alcohol drinks/day: <1     Tobacco Status: never  Exercise-Depression-Behavior     Have you felt down or hopeless? no     Have you felt little pleasure in things? no     Depression Counseling: not indicated; screening negative for depression     Drug Use: never  Allergies (verified): No Known Drug Allergies  Social History: Smoking Status:  never Drug Use:  never  Review of Systems General:  Denies fever. Eyes:  Denies discharge. ENT:  Denies earache. CV:  Denies fatigue. Resp:  Denies cough. GI:  Denies abdominal pain, nausea, and vomiting. GU:  Denies dysuria. MS:  Denies joint pain. Derm:  Denies dryness. Neuro:  Denies headaches. Psych:  Complains of anxiety; denies depression.  Physical Exam  General:  alert.   Head:  normocephalic.   Eyes:  pupils round.   Ears:  R ear normal and L ear normal.  bil TM with bony landmarks present Nose:  no nasal discharge.   Mouth:  pharynx pink and moist and fair dentition.   Neck:  supple.   Chest Wall:  no mass.   Breasts:  no abnormal thickening.   Lungs:  normal  breath sounds.   Heart:  normal rate and regular rhythm.   Abdomen:  normal bowel sounds.   Rectal:  no external abnormalities.   Msk:  up to the exam table Extremities:  no edema Neurologic:  gait normal.   Skin:  varicose veins Psych:  Oriented X3.    Pelvic Exam  Vulva:      normal appearance.   Urethra and Bladder:      Urethra--normal.   Vagina:      scant white discharge Cervix:      midposition.   Uterus:      smooth.   Adnexa:      nontender bilaterally.   Rectum:      normal, heme negative stool.      Impression & Recommendations:  Problem # 1:  ROUTINE GYNECOLOGICAL EXAMINATION (ICD-V72.31) rec optho and dental exam Orders: UA Dipstick w/o Micro (manual) (21308) KOH/ WET  Mount [id number]) Pap Smear, Thin Prep ( Collection of) (O9629) Hemoccult Guaiac-1 spec.(in office) (82270)  Problem # 2:  UNSPECIFIED BREAST SCREENING (ICD-V76.10) self breast exam handout given today with pt mammogram scheduled Orders Mammogram (Screening) (Mammo)  Problem # 3:  ANXIETY (ICD-300.00)  Orders: EKG w/ Interpretation (93000) T-TSH (52841-32440)  Problem # 4:  SEXUAL ACTIVITY, HIGH RISK (ICD-V69.2) again reviewed with pt that condom use is only beneficial for prevention of std prevention Orders: T- GC Chlamydia (10272)  Problem # 5:  OBESITY (ICD-278.00)  Orders: T-Lipid Profile (53664-40347)  Complete Medication List: 1)  Multivitamins Tabs (Multiple vitamin) 2)  Calcium 600-200 Mg-unit Tabs (Calcium-vitamin d) .... Take 1 tablet daily 3)  Ortho Micronor 0.35 Mg Tabs (Norethindrone) .... One tablet by mouth daily  Other Orders: T-Comprehensive Metabolic Panel [phone]) T-CBC w/Diff (64332-95188) Rapid HIV  (41660)   Patient Instructions: 1)  Your labs will be checked today and you will be notified of the results. 2)  Keep taking micronor by mouth daily. 3)  Keep your appointment for mammogram 4)  Follow up here as needed   Orders Added: 1)  EKG w/ Interpretation [93000] 2)  UA Dipstick w/o Micro (manual) [81002] 3)  KOH/ WET Mount [87210] 4)  Pap Smear, Thin Prep ( Collection of) [Q0091] 5)  T- GC Chlamydia [63016] 6)  T-Comprehensive Metabolic Panel [80053-22900] 7)  T-CBC w/Diff [01093-23557] 8)  Rapid HIV  [92370] 9)  T-TSH [32202-54270] 10)  Hemoccult Guaiac-1 spec.(in office) [82270] 11)  T-Lipid Profile [80061-22930] 12)  Mammogram (Screening) [Mammo]    Laboratory Results   Urine Tests  Date/Time Received: May 30, 2010 11:24 AM   Routine Urinalysis   Color: lt. yellow Glucose: negative   (Normal Range: Negative) Bilirubin: negative   (Normal Range: Negative) Ketone: negative   (Normal Range: Negative) Spec. Gravity:  1.025   (Normal Range: 1.003-1.035) Blood: negative   (Normal Range: Negative) pH: 6.0   (Normal Range: 5.0-8.0) Protein: negative   (Normal Range: Negative) Urobilinogen: 0.2   (Normal Range: 0-1) Nitrite: negative   (Normal Range: Negative) Leukocyte Esterace: trace   (Normal Range: Negative)    Date/Time Received: May 30, 2010 1:06 PM   Wet Mount/KOH Source: vaginal WBC/hpf: 1-5 Bacteria/hpf: rare Clue cells/hpf: none Yeast/hpf: none Trichomonas/hpf: none  Other Tests  Rapid HIV: negative  Stool - Occult Blood Hemmoccult #1: negative Date: 05/30/2010    Prevention & Chronic Care Immunizations   Influenza vaccine: Fluvax 3+  (03/27/2010)    Tetanus booster: 06/05/2006: historical per pt    Pneumococcal vaccine: Not documented  Other Screening   Pap smear:  Specimen Adequacy: Satisfactory for evaluation.   Interpretation/Result:Negative for intraepithelial Lesion or Malignancy.     (06/02/2009)   Pap smear due: 06/2010    Mammogram: BI-RADS CATEGORY 1:  Negative.^MM DIGITAL DIAGNOSTIC UNILAT L  (06/09/2009)   Smoking status: never  (05/30/2010)  Lipids   Total Cholesterol: 159  (06/02/2009)   LDL: 83  (06/02/2009)   LDL Direct: Not documented   HDL: 62  (06/02/2009)   Triglycerides: 70  (06/02/2009)    EKG  Procedure date:  05/30/2010  Findings:      NSR   Laboratory Results   Urine Tests    Routine Urinalysis   Color: lt. yellow Glucose: negative   (Normal Range: Negative) Bilirubin: negative   (Normal Range: Negative) Ketone: negative   (Normal Range: Negative) Spec. Gravity: 1.025   (Normal Range: 1.003-1.035) Blood: negative   (Normal Range: Negative) pH: 6.0   (Normal Range: 5.0-8.0) Protein: negative   (Normal Range: Negative) Urobilinogen: 0.2   (Normal Range: 0-1) Nitrite: negative   (Normal Range: Negative) Leukocyte Esterace: trace   (Normal Range: Negative)      Wet Mount Wet Mount KOH: Negative  Other  Tests  Rapid HIV: negative  Stool - Occult Blood Hemmoccult #1: negative

## 2010-07-06 NOTE — Letter (Signed)
Summary: CALL A NURSE  CALL A NURSE   Imported By: Arta Bruce 06/26/2010 15:32:52  _____________________________________________________________________  External Attachment:    Type:   Image     Comment:   External Document

## 2010-07-06 NOTE — Progress Notes (Signed)
Summary: Yeast infection  Phone Note Outgoing Call   Summary of Call: notify pt that she has a yeast infection. This can be easily treated with fluconozole 150mg  by mouth x 1 dose it is not sexually transmitted. Rx in basket - fax to pt's pharmacy Initial call taken by: Lehman Prom FNP,  June 02, 2010 8:23 AM  Follow-up for Phone Call        Crystal Gross  June 02, 2010 5:20 PM Left message on machine for pt to return call to the office  Crystal Gross  June 06, 2010 9:13 AM pt informed of above information.  Rx faxed to [facility] in [place]    New/Updated Medications: FLUCONAZOLE 150 MG TABS (FLUCONAZOLE) One tablet by mouth x 1 dose Prescriptions: FLUCONAZOLE 150 MG TABS (FLUCONAZOLE) One tablet by mouth x 1 dose  #1 x 0   Entered and Authorized by:   Lehman Prom FNP   Signed by:   Lehman Prom FNP on 06/02/2010   Method used:   Print then Give to Patient   RxID:   [id number]

## 2010-07-06 NOTE — Letter (Signed)
Summary: *HSN Results Follow up  [facility]  [address] [place], [place] [address]   Phone: [phone]  Fax: [phone]      06/01/2010   [place] [address] [place], [place]  [address]   Dear  Ms. Michiel Cowboy,                            ____S.Drinkard,FNP   ____D. Gore,FNP       ____B. McPherson,MD   ____V. Rankins,MD    ____E. Mulberry,MD    _X___N. Daphine Deutscher, FNP  ____D. Reche Dixon, MD    ____K. Philipp Deputy, MD    ____Other     This letter is to inform you that your recent test(s):  ___X____Pap Smear    ___X____Lab Test     _______X-ray    ___X____ is within acceptable limits  _______ requires a medication change  _______ requires a follow-up lab visit  _______ requires a follow-up visit with your provider   Comments:  Labs done during recent office visit were all ok.  Pap Smear results ____________________________________.        _________________________________________________________ If you have any questions, please contact our office [phone].                    Sincerely,    Lehman Prom FNP [facility]

## 2010-07-06 NOTE — Letter (Signed)
Summary: TEST ORDER FORM//MAMMOGRAM//APPT DATE &TIME  TEST ORDER FORM//MAMMOGRAM//APPT DATE &TIME   Imported By: Arta Bruce 06/06/2010 15:07:07  _____________________________________________________________________  External Attachment:    Type:   Image     Comment:   External Document

## 2010-07-13 ENCOUNTER — Ambulatory Visit: Payer: Self-pay

## 2010-07-18 ENCOUNTER — Encounter: Payer: Self-pay | Admitting: Internal Medicine

## 2010-08-15 LAB — URINALYSIS, ROUTINE W REFLEX MICROSCOPIC
Bilirubin Urine: NEGATIVE
Glucose, UA: NEGATIVE mg/dL
Nitrite: NEGATIVE
Protein, ur: 30 mg/dL — AB
Specific Gravity, Urine: >1.03 — ABNORMAL HIGH (ref 1.005–1.030)
Urobilinogen, UA: 1 mg/dL (ref 0.0–1.0)
pH: 6 (ref 5.0–8.0)

## 2010-08-15 LAB — URINE MICROSCOPIC-ADD ON

## 2010-08-15 LAB — POCT PREGNANCY, URINE: Preg Test, Ur: NEGATIVE

## 2010-10-20 NOTE — Group Therapy Note (Signed)
NAMESHAUNI, HENNER NO.:  [id number]   MEDICAL RECORD NO.:  [id number]          PATIENT TYPE:  WOC   LOCATION:  [facility]                   FACILITY:  [facility]   PHYSICIAN:  Argentina Donovan, MD        DATE OF BIRTH:  [birth year]   DATE OF SERVICE:                                    CLINIC NOTE   Patient is a 45 year old gravida 3, para 3, 0-0-3 with the youngest child 44  years old, who had a normal Pap smear at the health center in May of this  year.  She went in to the emergency room several weeks ago because of an  extremely heavy period and at that time was told by one of the physicians  that saw her, that she has a white nodule on her cervix that could be  malignant.  The patient comes in quite nervous from that.  We discussed her  periods and told her to start taking a calendar since this was her normal  period except for the amount as they may be getting heavier.  She is to take  iron for heavy periods.   PHYSICAL EXAMINATION:  External genitalia is normal.  BUS within normal  limits.  The vagina is clean and well rugated.  Cervix is clean and parous  with a slight cervical erosion, and just above the erosion at approximately  11:30, there is a 5 mm, glistening, white, nabothian cyst, which is I am  sure what the examiner was telling the patient.  The uterus is normal size,  and consistency and adnexa is normal.   IMPRESSION:  Normal gynecological examination with nabothian cyst.       PR/MEDQ  D:  01/09/2005  T:  01/09/2005  Job:  [id number]

## 2010-12-01 ENCOUNTER — Emergency Department (HOSPITAL_COMMUNITY)
Admission: EM | Admit: 2010-12-01 | Discharge: 2010-12-01 | Disposition: A | Discharge status: Home / Self Care | Payer: Medicaid Other | Attending: Emergency Medicine | Admitting: Emergency Medicine

## 2010-12-01 DIAGNOSIS — T675XXA Heat exhaustion, unspecified, initial encounter: Secondary | ICD-10-CM | POA: Insufficient documentation

## 2010-12-01 LAB — URINALYSIS, ROUTINE W REFLEX MICROSCOPIC
Bilirubin Urine: NEGATIVE
Glucose, UA: NEGATIVE mg/dL
Hgb urine dipstick: NEGATIVE
Leukocytes, UA: NEGATIVE
Nitrite: NEGATIVE
Protein, ur: NEGATIVE mg/dL
Specific Gravity, Urine: 1.01 (ref 1.005–1.030)
Urobilinogen, UA: 0.2 mg/dL (ref 0.0–1.0)
pH: 7 (ref 5.0–8.0)

## 2010-12-01 LAB — DIFFERENTIAL
Basophils Absolute: 0.1 10*3/uL (ref 0.0–0.1)
Basophils Relative: 1 % (ref 0–1)
Eosinophils Absolute: 0.1 10*3/uL (ref 0.0–0.7)
Eosinophils Relative: 1 % (ref 0–5)
Lymphocytes Relative: 20 % (ref 12–46)
Lymphs Abs: 1.6 10*3/uL (ref 0.7–4.0)
Monocytes Absolute: 0.5 10*3/uL (ref 0.1–1.0)
Monocytes Relative: 6 % (ref 3–12)
Neutro Abs: 5.9 10*3/uL (ref 1.7–7.7)
Neutrophils Relative %: 72 % (ref 43–77)

## 2010-12-01 LAB — BASIC METABOLIC PANEL
BUN: 14 mg/dL (ref 6–23)
CO2: 25 mEq/L (ref 19–32)
Calcium: 9.5 mg/dL (ref 8.4–10.5)
Chloride: 105 mEq/L (ref 96–112)
Creatinine, Ser: 0.63 mg/dL (ref 0.50–1.10)
GFR calc Af Amer: >60 mL/min (ref >60)
GFR calc non Af Amer: >60 mL/min (ref >60)
Glucose, Bld: 96 mg/dL (ref 70–99)
Potassium: 3.7 mEq/L (ref 3.5–5.1)
Sodium: 138 mEq/L (ref 135–145)

## 2010-12-01 LAB — CBC
HCT: 34.2 % — ABNORMAL LOW (ref 36.0–46.0)
Hemoglobin: 11.4 g/dL — ABNORMAL LOW (ref 12.0–15.0)
MCH: 26.3 pg (ref 26.0–34.0)
MCHC: 33.3 g/dL (ref 30.0–36.0)
MCV: 79 fL (ref 78.0–100.0)
Platelets: 370 10*3/uL (ref 150–400)
RBC: 4.33 MIL/uL (ref 3.87–5.11)
RDW: 14.8 % (ref 11.5–15.5)
WBC: 8.2 10*3/uL (ref 4.0–10.5)

## 2010-12-01 LAB — POCT PREGNANCY, URINE: Preg Test, Ur: NEGATIVE

## 2010-12-05 ENCOUNTER — Emergency Department (HOSPITAL_COMMUNITY)
Admission: EM | Admit: 2010-12-05 | Discharge: 2010-12-05 | Disposition: A | Discharge status: Home / Self Care | Payer: Medicaid Other | Attending: Emergency Medicine | Admitting: Emergency Medicine

## 2010-12-05 DIAGNOSIS — T622X1A Toxic effect of other ingested (parts of) plant(s), accidental (unintentional), initial encounter: Secondary | ICD-10-CM | POA: Insufficient documentation

## 2010-12-05 DIAGNOSIS — L255 Unspecified contact dermatitis due to plants, except food: Secondary | ICD-10-CM | POA: Insufficient documentation

## 2010-12-07 ENCOUNTER — Emergency Department: Payer: Self-pay | Admitting: Emergency Medicine

## 2011-02-22 LAB — WET PREP, GENITAL
Clue Cells Wet Prep HPF POC: NONE SEEN
Trich, Wet Prep: NONE SEEN
Yeast Wet Prep HPF POC: NONE SEEN

## 2011-02-22 LAB — HEPATITIS C ANTIBODY: HCV Ab: NEGATIVE

## 2011-02-22 LAB — GC/CHLAMYDIA PROBE AMP, GENITAL
Chlamydia, DNA Probe: NEGATIVE
GC Probe Amp, Genital: NEGATIVE

## 2011-02-22 LAB — POCT URINALYSIS DIP (DEVICE)
Glucose, UA: NEGATIVE
Hgb urine dipstick: NEGATIVE
Ketones, ur: 15 — AB
Nitrite: NEGATIVE
Operator id: 247071
Protein, ur: 30 — AB
Specific Gravity, Urine: >=1.03
Urobilinogen, UA: 0.2
pH: 5.5

## 2011-02-22 LAB — RPR: RPR Ser Ql: NONREACTIVE

## 2011-02-22 LAB — HEPATITIS B SURFACE ANTIGEN: Hepatitis B Surface Ag: NEGATIVE

## 2011-02-22 LAB — HEPATITIS B SURFACE ANTIBODY,QUALITATIVE: Hep B S Ab: POSITIVE — AB

## 2011-02-22 LAB — HEPATITIS B CORE ANTIBODY, TOTAL: Hep B Core Total Ab: NEGATIVE

## 2011-02-22 LAB — HIV ANTIBODY (ROUTINE TESTING W REFLEX): HIV: NONREACTIVE

## 2011-02-23 LAB — URINALYSIS, ROUTINE W REFLEX MICROSCOPIC
Bilirubin Urine: NEGATIVE
Glucose, UA: NEGATIVE
Hgb urine dipstick: NEGATIVE
Nitrite: NEGATIVE
Protein, ur: NEGATIVE
Specific Gravity, Urine: 1.039 — ABNORMAL HIGH
Urobilinogen, UA: 0.2
pH: 5.5

## 2011-02-23 LAB — PREGNANCY, URINE: Preg Test, Ur: NEGATIVE

## 2011-02-26 LAB — URINALYSIS, ROUTINE W REFLEX MICROSCOPIC
Bilirubin Urine: NEGATIVE
Glucose, UA: NEGATIVE
Hgb urine dipstick: NEGATIVE
Nitrite: NEGATIVE
Protein, ur: NEGATIVE
Specific Gravity, Urine: 1.02
Urobilinogen, UA: 0.2
pH: 6.5

## 2011-03-05 LAB — URINALYSIS, ROUTINE W REFLEX MICROSCOPIC
Bilirubin Urine: NEGATIVE
Glucose, UA: NEGATIVE
Hgb urine dipstick: NEGATIVE
Ketones, ur: NEGATIVE
Nitrite: NEGATIVE
Protein, ur: NEGATIVE
Specific Gravity, Urine: 1.023
Urobilinogen, UA: 1
pH: 7.5

## 2011-03-06 ENCOUNTER — Emergency Department (HOSPITAL_COMMUNITY): Payer: Medicaid Other

## 2011-03-06 ENCOUNTER — Emergency Department (HOSPITAL_COMMUNITY)
Admission: EM | Admit: 2011-03-06 | Discharge: 2011-03-06 | Disposition: A | Discharge status: Home / Self Care | Payer: Medicaid Other | Attending: Emergency Medicine | Admitting: Emergency Medicine

## 2011-03-06 DIAGNOSIS — M545 Low back pain, unspecified: Secondary | ICD-10-CM | POA: Insufficient documentation

## 2011-03-06 DIAGNOSIS — Y92410 Unspecified street and highway as the place of occurrence of the external cause: Secondary | ICD-10-CM | POA: Insufficient documentation

## 2011-03-06 DIAGNOSIS — M542 Cervicalgia: Secondary | ICD-10-CM | POA: Insufficient documentation

## 2011-03-06 DIAGNOSIS — S139XXA Sprain of joints and ligaments of unspecified parts of neck, initial encounter: Secondary | ICD-10-CM | POA: Insufficient documentation

## 2011-03-06 DIAGNOSIS — R51 Headache: Secondary | ICD-10-CM | POA: Insufficient documentation

## 2011-03-13 LAB — DIFFERENTIAL
Basophils Absolute: 0
Basophils Absolute: 0
Basophils Absolute: 0
Basophils Absolute: 0
Basophils Relative: 0
Basophils Relative: 0
Basophils Relative: 0
Basophils Relative: 1
Eosinophils Absolute: 0
Eosinophils Absolute: 0.1
Eosinophils Absolute: 0.1
Eosinophils Absolute: 0.1 — ABNORMAL LOW
Eosinophils Relative: 0
Eosinophils Relative: 1
Eosinophils Relative: 2
Eosinophils Relative: 2
Lymphocytes Relative: 20
Lymphocytes Relative: 24
Lymphocytes Relative: 28
Lymphocytes Relative: 9 — ABNORMAL LOW
Lymphs Abs: 1.2
Lymphs Abs: 1.7
Lymphs Abs: 2
Lymphs Abs: 2.1
Monocytes Absolute: 0.7
Monocytes Absolute: 0.7
Monocytes Absolute: 0.7
Monocytes Absolute: 0.8
Monocytes Relative: 10
Monocytes Relative: 5
Monocytes Relative: 8
Monocytes Relative: 9
Neutro Abs: 11.8 — ABNORMAL HIGH
Neutro Abs: 4.4
Neutro Abs: 5.9
Neutro Abs: 6
Neutrophils Relative %: 60
Neutrophils Relative %: 67
Neutrophils Relative %: 70
Neutrophils Relative %: 86 — ABNORMAL HIGH

## 2011-03-13 LAB — BASIC METABOLIC PANEL
BUN: 12
BUN: 12
BUN: 17
CO2: 27
CO2: 29
CO2: 30
Calcium: 9.1
Calcium: 9.5
Calcium: 9.6
Chloride: 103
Chloride: 103
Chloride: 104
Creatinine, Ser: 0.6
Creatinine, Ser: 0.7
Creatinine, Ser: 0.74
GFR calc Af Amer: >60
GFR calc Af Amer: >60
GFR calc Af Amer: >60
GFR calc non Af Amer: >60
GFR calc non Af Amer: >60
GFR calc non Af Amer: >60
Glucose, Bld: 139 — ABNORMAL HIGH
Glucose, Bld: 84
Glucose, Bld: 96
Potassium: 3.4 — ABNORMAL LOW
Potassium: 3.5
Potassium: 3.8
Sodium: 138
Sodium: 139
Sodium: 140

## 2011-03-13 LAB — RAPID URINE DRUG SCREEN, HOSP PERFORMED
Amphetamines: NOT DETECTED
Barbiturates: NOT DETECTED
Benzodiazepines: NOT DETECTED
Cocaine: NOT DETECTED
Opiates: NOT DETECTED
Tetrahydrocannabinol: NOT DETECTED

## 2011-03-13 LAB — I-STAT 8, (EC8 V) (CONVERTED LAB)
Acid-Base Excess: 1
BUN: 17
Bicarbonate: 26.6 — ABNORMAL HIGH
Chloride: 105
Glucose, Bld: 81
HCT: 35 — ABNORMAL LOW
Hemoglobin: 11.9 — ABNORMAL LOW
Operator id: 261381
Potassium: 4.2
Sodium: 139
TCO2: 28
pCO2, Ven: 44.8 — ABNORMAL LOW
pH, Ven: 7.382 — ABNORMAL HIGH

## 2011-03-13 LAB — URINALYSIS, ROUTINE W REFLEX MICROSCOPIC
Bilirubin Urine: NEGATIVE
Bilirubin Urine: NEGATIVE
Glucose, UA: NEGATIVE
Glucose, UA: NEGATIVE
Hgb urine dipstick: NEGATIVE
Hgb urine dipstick: NEGATIVE
Ketones, ur: NEGATIVE
Nitrite: NEGATIVE
Nitrite: NEGATIVE
Protein, ur: NEGATIVE
Protein, ur: NEGATIVE
Specific Gravity, Urine: 1.021
Specific Gravity, Urine: 1.035 — ABNORMAL HIGH
Urobilinogen, UA: 1
Urobilinogen, UA: 1
pH: 6
pH: 7

## 2011-03-13 LAB — CBC
HCT: 32.7 — ABNORMAL LOW
HCT: 33.2 — ABNORMAL LOW
HCT: 35.7 — ABNORMAL LOW
HCT: 36.7
Hemoglobin: 10.9 — ABNORMAL LOW
Hemoglobin: 11.1 — ABNORMAL LOW
Hemoglobin: 11.8 — ABNORMAL LOW
Hemoglobin: 12.3
MCHC: 32.8
MCHC: 33.2
MCHC: 33.5
MCHC: 33.8
MCV: 79.2
MCV: 79.6
MCV: 80
MCV: 81.3
Platelets: 343
Platelets: 368
Platelets: 391
Platelets: 416 — ABNORMAL HIGH
RBC: 4.08
RBC: 4.11
RBC: 4.46
RBC: 4.64
RDW: 15 — ABNORMAL HIGH
RDW: 15.1
RDW: 15.1 — ABNORMAL HIGH
RDW: 15.2 — ABNORMAL HIGH
WBC: 13.8 — ABNORMAL HIGH
WBC: 7.3
WBC: 8.5
WBC: 9

## 2011-03-13 LAB — CARBOXYHEMOGLOBIN
Carboxyhemoglobin: 1.1
Methemoglobin: 0.8
O2 Saturation: 99.9
Total hemoglobin: 10.6 — ABNORMAL LOW

## 2011-03-13 LAB — ETHANOL: Alcohol, Ethyl (B): <5

## 2011-03-13 LAB — POCT I-STAT CREATININE
Creatinine, Ser: 0.6
Operator id: 261381

## 2011-03-13 LAB — PREGNANCY, URINE
Preg Test, Ur: NEGATIVE
Preg Test, Ur: NEGATIVE
Preg Test, Ur: NEGATIVE

## 2011-03-13 LAB — D-DIMER, QUANTITATIVE (NOT AT ARMC): D-Dimer, Quant: 0.57 — ABNORMAL HIGH

## 2011-03-13 LAB — POCT CARDIAC MARKERS
CKMB, poc: <1 — ABNORMAL LOW
Myoglobin, poc: 47.2
Operator id: 4761
Troponin i, poc: <0.05

## 2011-03-15 LAB — GC/CHLAMYDIA PROBE AMP, GENITAL
Chlamydia, DNA Probe: NEGATIVE
Chlamydia, DNA Probe: NEGATIVE
GC Probe Amp, Genital: NEGATIVE
GC Probe Amp, Genital: NEGATIVE

## 2011-03-15 LAB — CBC
HCT: 36.3
Hemoglobin: 12
MCHC: 33
MCV: 79.1
Platelets: 395
RBC: 4.59
RDW: 15.7 — ABNORMAL HIGH
WBC: 14.6 — ABNORMAL HIGH

## 2011-03-15 LAB — URINALYSIS, ROUTINE W REFLEX MICROSCOPIC
Bilirubin Urine: NEGATIVE
Glucose, UA: NEGATIVE
Ketones, ur: NEGATIVE
Leukocytes, UA: NEGATIVE
Nitrite: NEGATIVE
Protein, ur: NEGATIVE
Specific Gravity, Urine: 1.015
Urobilinogen, UA: 1
pH: 7

## 2011-03-15 LAB — WET PREP, GENITAL
Clue Cells Wet Prep HPF POC: NONE SEEN
Clue Cells Wet Prep HPF POC: NONE SEEN
Trich, Wet Prep: NONE SEEN
Trich, Wet Prep: NONE SEEN
Yeast Wet Prep HPF POC: NONE SEEN
Yeast Wet Prep HPF POC: NONE SEEN

## 2011-03-15 LAB — POCT PREGNANCY, URINE
Operator id: 29008
Operator id: 239701
Preg Test, Ur: NEGATIVE
Preg Test, Ur: NEGATIVE

## 2011-03-15 LAB — URINE MICROSCOPIC-ADD ON

## 2011-03-19 LAB — COMPREHENSIVE METABOLIC PANEL
ALT: 17
AST: 19
Albumin: 3.5
Alkaline Phosphatase: 67
BUN: 13
CO2: 26
Calcium: 8.8
Chloride: 102
Creatinine, Ser: 0.57
GFR calc Af Amer: >60
GFR calc non Af Amer: >60
Glucose, Bld: 93
Potassium: 3.3 — ABNORMAL LOW
Sodium: 135
Total Bilirubin: 0.6
Total Protein: 7.6

## 2011-03-19 LAB — URINALYSIS, ROUTINE W REFLEX MICROSCOPIC
Bilirubin Urine: NEGATIVE
Glucose, UA: NEGATIVE
Hgb urine dipstick: NEGATIVE
Ketones, ur: NEGATIVE
Nitrite: NEGATIVE
Protein, ur: NEGATIVE
Specific Gravity, Urine: >1.03 — ABNORMAL HIGH
Urobilinogen, UA: 0.2
pH: 6

## 2011-03-19 LAB — RAPID URINE DRUG SCREEN, HOSP PERFORMED
Amphetamines: NOT DETECTED
Barbiturates: NOT DETECTED
Benzodiazepines: NOT DETECTED
Cocaine: NOT DETECTED
Opiates: NOT DETECTED
Tetrahydrocannabinol: NOT DETECTED

## 2011-03-19 LAB — POCT PREGNANCY, URINE
Operator id: 275371
Preg Test, Ur: NEGATIVE

## 2011-03-19 LAB — WET PREP, GENITAL
Clue Cells Wet Prep HPF POC: NONE SEEN
Trich, Wet Prep: NONE SEEN
Yeast Wet Prep HPF POC: NONE SEEN

## 2011-03-19 LAB — RPR: RPR Ser Ql: NONREACTIVE

## 2011-03-19 LAB — GC/CHLAMYDIA PROBE AMP, GENITAL
Chlamydia, DNA Probe: NEGATIVE
GC Probe Amp, Genital: NEGATIVE

## 2011-03-19 LAB — ETHANOL: Alcohol, Ethyl (B): <5

## 2011-03-20 LAB — URINALYSIS, ROUTINE W REFLEX MICROSCOPIC
Bilirubin Urine: NEGATIVE
Glucose, UA: NEGATIVE
Hgb urine dipstick: NEGATIVE
Ketones, ur: NEGATIVE
Nitrite: NEGATIVE
Protein, ur: NEGATIVE
Specific Gravity, Urine: 1.017
Urobilinogen, UA: 0.2
pH: 7.5

## 2011-03-22 LAB — BASIC METABOLIC PANEL
BUN: 13
CO2: 24
Calcium: 8.8
Chloride: 110
Creatinine, Ser: 0.55
GFR calc Af Amer: >60
GFR calc non Af Amer: >60
Glucose, Bld: 104 — ABNORMAL HIGH
Potassium: 3.8
Sodium: 139

## 2011-03-22 LAB — POCT CARDIAC MARKERS
CKMB, poc: <1 — ABNORMAL LOW
Myoglobin, poc: 35.4
Operator id: 4761
Troponin i, poc: <0.05

## 2011-03-22 LAB — CBC
HCT: 31.8 — ABNORMAL LOW
Hemoglobin: 10.7 — ABNORMAL LOW
MCHC: 33.7
MCV: 79.1
Platelets: 286
RBC: 4.02
RDW: 15.9 — ABNORMAL HIGH
WBC: 8.8

## 2011-03-22 LAB — DIFFERENTIAL
Basophils Absolute: 0
Basophils Relative: 0
Eosinophils Absolute: 0.1
Eosinophils Relative: 1
Lymphocytes Relative: 18
Lymphs Abs: 1.6
Monocytes Absolute: 0.7
Monocytes Relative: 8
Neutro Abs: 6.5
Neutrophils Relative %: 74

## 2011-03-22 LAB — D-DIMER, QUANTITATIVE: D-Dimer, Quant: 0.48

## 2011-05-05 DIAGNOSIS — R209 Unspecified disturbances of skin sensation: Secondary | ICD-10-CM | POA: Insufficient documentation

## 2011-05-05 DIAGNOSIS — R002 Palpitations: Secondary | ICD-10-CM | POA: Insufficient documentation

## 2011-05-05 DIAGNOSIS — M25519 Pain in unspecified shoulder: Secondary | ICD-10-CM | POA: Insufficient documentation

## 2011-05-06 ENCOUNTER — Encounter: Payer: Self-pay | Admitting: *Deleted

## 2011-05-06 ENCOUNTER — Emergency Department (HOSPITAL_COMMUNITY)
Admission: EM | Admit: 2011-05-06 | Discharge: 2011-05-06 | Disposition: A | Discharge status: Home / Self Care | Payer: Medicaid Other | Attending: Emergency Medicine | Admitting: Emergency Medicine

## 2011-05-06 DIAGNOSIS — R002 Palpitations: Secondary | ICD-10-CM

## 2011-05-06 HISTORY — DX: Anxiety disorder, unspecified: F41.9

## 2011-05-06 MED ORDER — IBUPROFEN 800 MG PO TABS
800.0000 mg | ORAL_TABLET | Freq: Once | ORAL | Status: AC
  Administered 2011-05-06: 800 mg via ORAL
  Filled 2011-05-06: qty 1

## 2011-05-06 NOTE — ED Notes (Signed)
Condition unchanged. See triage note

## 2011-05-06 NOTE — ED Notes (Signed)
Left arm pain 10/10.

## 2011-05-06 NOTE — ED Notes (Signed)
Pt states that she received a flu shot at [facility] on 04/18/2011 and has had left arm/shoulder pain since the injection. Pt states pain is "a little better", but that it still hurts. Pt denies fever, nausea or vomiting. No redness, swelling at the site. Pt is afebrile at this time. Pt also has a dental sore on upper right gum line that she wants to have checked out. Pt alert and oriented x 4, neuro intact.

## 2011-05-10 NOTE — ED Provider Notes (Signed)
History     CSN: [id number] Arrival date & time: 05/06/2011 12:55 AM   First MD Initiated Contact with Patient 05/06/11 0200      Chief Complaint  Patient presents with  . Shoulder Pain     Patient is a 45 y.o. female presenting with shoulder pain. The history is provided by the patient.  Shoulder Pain This is a new problem. The current episode started 1 to 4 weeks ago. The problem occurs constantly. The problem has been unchanged. Pertinent negatives include no fever, joint swelling, neck pain or rash. She has tried nothing for the symptoms.  Reports persistent pain at the injection site of flu shot 2 weeks ago. Indicates pain to (L) shoulder, just above injection site. States onset of the pain just after receiving the injection that has persisted. Pt also c/o vague "tickling sensation" in her chest. States at times it feels like her heart skips a beat, Denies actual CP, SOB or other related symptoms. States last episode of this "sensation" approx 1 hour ago.    Past Medical History  Diagnosis Date  . Anxiety     Past Surgical History  Procedure Date  . Appendectomy     Family History  Problem Relation Age of Onset  . Diabetes Mother   . Hypertension Mother   . Stroke Mother   . Heart failure Father   . Migraines Father     History  Substance Use Topics  . Smoking status: Never Smoker   . Smokeless tobacco: Never Used  . Alcohol Use: No    OB History    Grav Para Term Preterm Abortions TAB SAB Ect Mult Living   3 3 3              Review of Systems  Constitutional: Negative.  Negative for fever.  HENT: Negative.  Negative for neck pain.   Eyes: Negative.   Respiratory: Negative.   Cardiovascular: Negative.   Gastrointestinal: Negative.   Genitourinary: Negative.   Musculoskeletal: Negative.  Negative for joint swelling.  Skin: Negative.  Negative for rash.  Neurological: Negative.   Hematological: Negative.   Psychiatric/Behavioral: Negative.      Allergies  Review of patient's allergies indicates no known allergies.  Home Medications   Current Outpatient Rx  Name Route Sig Dispense Refill  . TAB-A-VITE/IRON PO TABS Oral Take 1 tablet by mouth daily.        BP 124/86  Pulse 90  Temp(Src) 97.6 F (36.4 C) (Oral)  Resp 16  SpO2 100%  LMP 04/10/2011  Physical Exam  Constitutional: She is oriented to person, place, and time. She appears well-developed and well-nourished.  HENT:  Head: Normocephalic and atraumatic.  Eyes: Conjunctivae are normal.  Neck: Neck supple.  Cardiovascular: Normal rate and regular rhythm.   Pulmonary/Chest: Effort normal and breath sounds normal.  Musculoskeletal:       Left shoulder: She exhibits tenderness. She exhibits normal range of motion, no bony tenderness, no swelling, no effusion and no crepitus.       No boney or soft tissue abnormalities of (L) shoulder.  Neurological: She is alert and oriented to person, place, and time. She has normal reflexes.  Skin: Skin is warm and dry.  Psychiatric: She has a normal mood and affect.    ED Course  Procedures  Date: 05/11/2011  Rate: 92  Rhythm: normal sinus rhythm  QRS Axis: normal  Intervals: normal  ST/T Wave abnormalities: normal  Conduction Disutrbances:none  Narrative Interpretation:  Old EKG Reviewed: none available  Findings and impression discussed w/ pt. Will plan for d/c home w/PCP referrals. Pt agreeable w/ plan.  Labs Reviewed - No data to display No results found.   1. Palpitations       MDM  HPI, Normal PE, normal EKG. No shoulder or injection site abnormalities. Likely benign palpitations.        Leanne Chang, NP 05/11/11 1014

## 2011-05-11 NOTE — ED Provider Notes (Signed)
Medical screening examination/treatment/procedure(s) were performed by non-physician practitioner and as supervising physician I was immediately available for consultation/collaboration.  Karissa Meenan, MD 05/11/11 1557 

## 2011-05-27 ENCOUNTER — Emergency Department (HOSPITAL_COMMUNITY)
Admission: EM | Admit: 2011-05-27 | Discharge: 2011-05-27 | Disposition: A | Discharge status: Home / Self Care | Payer: Medicaid Other | Attending: Emergency Medicine | Admitting: Emergency Medicine

## 2011-05-27 ENCOUNTER — Encounter (HOSPITAL_COMMUNITY): Payer: Self-pay | Admitting: *Deleted

## 2011-05-27 DIAGNOSIS — M25512 Pain in left shoulder: Secondary | ICD-10-CM

## 2011-05-27 DIAGNOSIS — M25519 Pain in unspecified shoulder: Secondary | ICD-10-CM | POA: Insufficient documentation

## 2011-05-27 MED ORDER — DIAZEPAM 5 MG PO TABS: 5.0000 mg | ORAL_TABLET | Freq: Four times a day (QID) | ORAL | Status: AC | PRN

## 2011-05-27 MED ORDER — IBUPROFEN 200 MG PO TABS
600.0000 mg | ORAL_TABLET | Freq: Once | ORAL | Status: AC
  Administered 2011-05-27: 600 mg via ORAL
  Filled 2011-05-27: qty 3

## 2011-05-27 NOTE — ED Provider Notes (Signed)
History     CSN: [id number]  Arrival date & time 05/27/11  1343   First MD Initiated Contact with Patient 05/27/11 1608      Chief Complaint  Patient presents with  . Arm Pain    (Consider location/radiation/quality/duration/timing/severity/associated sxs/prior treatment) Patient is a 45 y.o. female presenting with arm pain. The history is provided by the patient.  Arm Pain This is a new problem. The current episode started more than 1 month ago. The problem occurs constantly. The problem has been unchanged. Pertinent negatives include no chest pain, chills, fever, headaches, joint swelling, neck pain, numbness, rash or weakness. Exacerbated by: palpation, movement. She has tried nothing for the symptoms.  Pt states she got flu shot on Nov 14th. States since then pain in the area where the shot was adminietered. Pt also reports tingling sensaiton in that hand on and off. States pain radiates up left neck. Went to pcp on 18th, was given a steroid pack. Was also seen in ED for the same. Denies numbness or weakness in upper or lower extremities, denies fever, chills, neck pain, injury to that arm.   Past Medical History  Diagnosis Date  . Anxiety     Past Surgical History  Procedure Date  . Appendectomy     Family History  Problem Relation Age of Onset  . Diabetes Mother   . Hypertension Mother   . Stroke Mother   . Heart failure Father   . Migraines Father     History  Substance Use Topics  . Smoking status: Never Smoker   . Smokeless tobacco: Never Used  . Alcohol Use: No    OB History    Grav Para Term Preterm Abortions TAB SAB Ect Mult Living   3 3 3              Review of Systems  Constitutional: Negative for fever and chills.  HENT: Negative for neck pain and neck stiffness.   Eyes: Negative.   Respiratory: Negative.   Cardiovascular: Negative for chest pain.  Gastrointestinal: Negative.   Genitourinary: Negative.   Musculoskeletal: Negative for joint  swelling.  Skin: Negative for rash.  Neurological: Negative for weakness, numbness and headaches.  Psychiatric/Behavioral: Negative.     Allergies  Review of patient's allergies indicates no known allergies.  Home Medications   Current Outpatient Rx  Name Route Sig Dispense Refill  . IBUPROFEN 200 MG PO TABS Oral Take 200-800 mg by mouth every 6 (six) hours as needed. For pain     . TAB-A-VITE/IRON PO TABS Oral Take 1 tablet by mouth daily.        BP 108/62  Pulse 95  Temp(Src) 98.2 F (36.8 C) (Oral)  Resp 18  SpO2 100%  LMP 05/10/2011  Physical Exam  Nursing note and vitals reviewed. Constitutional: She is oriented to person, place, and time. She appears well-developed and well-nourished.  HENT:  Head: Normocephalic.  Neck: Neck supple.  Cardiovascular: Normal rate, regular rhythm and normal heart sounds.   Pulmonary/Chest: Effort normal and breath sounds normal. No respiratory distress.  Musculoskeletal:       Normal appearing left shoulder. Pin point tenderness to one spot over left deltoid. No redness, swelling noted. Full ROM of left shoulder. Normal strength. Distal radial pulse normal. Normal sensation over bilateral arms and hands. 5/5 and equal lower extremity strength. 2+ and equal patellar and brachioradialis and tricep reflexes.  Neurological: She is alert and oriented to person, place, and time.  Skin: Skin  is warm and dry. No rash noted.  Psychiatric: She has a normal mood and affect.    ED Course  Procedures (including critical care time)  Normal exam with only slight tenderness over left deltoid. Normal reflexes, normal strength, normal sensation of arms. Suspect possible muscle soreness vs possible nerve pain from the administered vaccine. Pt very anxious, states she thinks she has guillan barre. Pt has no signs of guillan barre. Reassured. Will d/c home.  MDM          Lottie Mussel, PA 05/27/11 [phone]

## 2011-05-27 NOTE — ED Notes (Signed)
Pt reports getting flu shot Nov 14th. Pt reports immediate constant left arm tingling pain 8/10. Pt went to the doctor around Nov 28th and was given prednisone and finished prescription with no pain relief. Reports Ibuprofen does not relieve pain, takes 800 mg/ day. Pain upon movement that sometimes radiates into chest and neck. Pt marked with skin pen area where shot was injected.

## 2011-05-28 NOTE — ED Provider Notes (Signed)
Medical screening examination/treatment/procedure(s) were performed by non-physician practitioner and as supervising physician I was immediately available for consultation/collaboration.   Geoffery Lyons, MD 05/28/11 [phone]

## 2011-06-06 ENCOUNTER — Emergency Department (INDEPENDENT_AMBULATORY_CARE_PROVIDER_SITE_OTHER)
Admission: EM | Admit: 2011-06-06 | Discharge: 2011-06-06 | Disposition: A | Discharge status: Home / Self Care | Payer: Medicaid Other | Source: Home / Self Care | Attending: Emergency Medicine | Admitting: Emergency Medicine

## 2011-06-06 ENCOUNTER — Encounter (HOSPITAL_COMMUNITY): Payer: Self-pay

## 2011-06-06 DIAGNOSIS — M67919 Unspecified disorder of synovium and tendon, unspecified shoulder: Secondary | ICD-10-CM

## 2011-06-06 DIAGNOSIS — M5412 Radiculopathy, cervical region: Secondary | ICD-10-CM

## 2011-06-06 DIAGNOSIS — M719 Bursopathy, unspecified: Secondary | ICD-10-CM

## 2011-06-06 MED ORDER — TRAMADOL HCL 50 MG PO TABS: 100.0000 mg | ORAL_TABLET | Freq: Three times a day (TID) | ORAL | Status: AC | PRN

## 2011-06-06 MED ORDER — MELOXICAM 15 MG PO TABS: 15.0000 mg | ORAL_TABLET | Freq: Every day | ORAL | Status: DC

## 2011-06-06 NOTE — ED Provider Notes (Signed)
History     CSN: [id number]  Arrival date & time 06/06/11  1502   First MD Initiated Contact with Patient 06/06/11 1619      Chief Complaint  Patient presents with  . Arm Pain    (Consider location/radiation/quality/duration/timing/severity/associated sxs/prior treatment) HPI Comments: The patient is a 46 year old female who presents tonight with left arm pain. This has been going on since November 14. She receives a flu vaccine in her left deltoid and [facility] and she experienced immediate pain in the area which radiated up in her neck and down her arm into her thumb and it has persisted and then. She's been to the emergency room once and [facility]. She was placed on 2 courses of prednisone but doesn't feel any better. She feels numbness and tingling in her thumb. The shoulder hurts to touch. She has pain on movement of the shoulder, including abduction, and internal and external rotation. There is no weakness in the arm. She did not have any swelling, redness, or heat. She denies any fever or chills with this episode. She has gotten the flu vaccine before without any side effects.  Patient is a 46 y.o. female presenting with arm pain.  Arm Pain    Past Medical History  Diagnosis Date  . Anxiety     Past Surgical History  Procedure Date  . Appendectomy     Family History  Problem Relation Age of Onset  . Diabetes Mother   . Hypertension Mother   . Stroke Mother   . Heart failure Father   . Migraines Father     History  Substance Use Topics  . Smoking status: Never Smoker   . Smokeless tobacco: Never Used  . Alcohol Use: No    OB History    Grav Para Term Preterm Abortions TAB SAB Ect Mult Living   3 3 3              Review of Systems  Musculoskeletal: Positive for arthralgias. Negative for myalgias, back pain, joint swelling and gait problem.  Skin: Negative for rash and wound.  Neurological: Positive for numbness.  Negative for weakness.    Allergies  Review of patient's allergies indicates not on file.  Home Medications   Current Outpatient Rx  Name Route Sig Dispense Refill  . DIAZEPAM 5 MG PO TABS Oral Take 1 tablet (5 mg total) by mouth every 6 (six) hours as needed (muscle spasms). 30 tablet 0  . IBUPROFEN 200 MG PO TABS Oral Take 200-800 mg by mouth every 6 (six) hours as needed. For pain     . MELOXICAM 15 MG PO TABS Oral Take 1 tablet (15 mg total) by mouth daily. 15 tablet 0  . TAB-A-VITE/IRON PO TABS Oral Take 1 tablet by mouth daily.      . TRAMADOL HCL 50 MG PO TABS Oral Take 2 tablets (100 mg total) by mouth every 8 (eight) hours as needed for pain. 30 tablet 0    BP 123/84  Pulse 76  Temp(Src) 98.6 F (37 C) (Oral)  Resp 16  SpO2 98%  LMP 05/10/2011  Physical Exam  Nursing note and vitals reviewed. Constitutional: She is oriented to person, place, and time. She appears well-developed and well-nourished. No distress.  Musculoskeletal: Normal range of motion. She exhibits tenderness. She exhibits no edema.       Exam of the shoulder reveals no swelling, redness, or heat. There is tenderness to palpation and from  the trapezius ridge, over the shoulder, scapula, deltoid area, upper arm, lower arm, and into the hand. All of her joints have full range of motion but there was pain on abduction and internal and external rotation. Impingement signs are positive. Muscle strength was normal. DTRs are normal. Sensation was equal and normal in all of her digits.  Neurological: She is alert and oriented to person, place, and time. She has normal strength and normal reflexes. She displays no atrophy. No sensory deficit. She exhibits normal muscle tone.  Skin: Skin is warm and dry. No rash noted. She is not diaphoretic.    ED Course  Procedures (including critical care time)  Labs Reviewed - No data to display No results found.   1. Cervical radiculopathy   2. Disorder of rotator cuff         MDM  She appears to have both a cervical radiculopathy and a rotator cuff tendinitis. Both of these came on immediately after an influenza vaccine. It's possible that she could have an immune mediated neuropathy or brachial plexitis secondary to the injection. I don't think there is any way that the injection could have impinged upon or hit or damaged the nerve going down her arm into her thumb. It's also possible that he could have some rotator cuff inflammation as well. I can't think of any tests that would be helpful for her tonight. I told her the pain persisted she may want to consider seeing a neurologist. She should definitely followup at [facility]. In the meantime she was given meloxicam and tramadol for the discomfort and some shoulder exercises to do.        Roque Lias, MD 06/06/11 [phone]

## 2011-06-06 NOTE — ED Notes (Signed)
States she has been having continued pain in her left deltoid area since 11-14 when she had a flu shot, no improvement w 2 courses of steroids

## 2011-07-02 ENCOUNTER — Other Ambulatory Visit (HOSPITAL_COMMUNITY)
Admission: RE | Admit: 2011-07-02 | Discharge: 2011-07-02 | Disposition: A | Discharge status: Home / Self Care | Payer: Medicaid Other | Source: Ambulatory Visit | Attending: Family Medicine | Admitting: Family Medicine

## 2011-07-02 ENCOUNTER — Other Ambulatory Visit: Payer: Self-pay | Admitting: Family Medicine

## 2011-07-02 DIAGNOSIS — Z01419 Encounter for gynecological examination (general) (routine) without abnormal findings: Secondary | ICD-10-CM | POA: Insufficient documentation

## 2011-07-02 DIAGNOSIS — Z1159 Encounter for screening for other viral diseases: Secondary | ICD-10-CM | POA: Insufficient documentation

## 2011-07-02 DIAGNOSIS — Z113 Encounter for screening for infections with a predominantly sexual mode of transmission: Secondary | ICD-10-CM | POA: Insufficient documentation

## 2011-12-10 ENCOUNTER — Other Ambulatory Visit: Payer: Self-pay | Admitting: Internal Medicine

## 2011-12-10 DIAGNOSIS — Z1231 Encounter for screening mammogram for malignant neoplasm of breast: Secondary | ICD-10-CM

## 2011-12-19 ENCOUNTER — Ambulatory Visit
Admission: RE | Admit: 2011-12-19 | Discharge: 2011-12-19 | Disposition: A | Discharge status: Home / Self Care | Payer: Medicaid Other | Source: Ambulatory Visit | Attending: Internal Medicine | Admitting: Internal Medicine

## 2011-12-19 DIAGNOSIS — Z1231 Encounter for screening mammogram for malignant neoplasm of breast: Secondary | ICD-10-CM

## 2012-02-26 ENCOUNTER — Encounter (HOSPITAL_COMMUNITY): Payer: Self-pay

## 2012-02-26 ENCOUNTER — Inpatient Hospital Stay (HOSPITAL_COMMUNITY)
Admission: AD | Admit: 2012-02-26 | Discharge: 2012-02-26 | Disposition: A | Discharge status: Home / Self Care | Payer: Medicaid Other | Source: Ambulatory Visit | Attending: Obstetrics and Gynecology | Admitting: Obstetrics and Gynecology

## 2012-02-26 DIAGNOSIS — N899 Noninflammatory disorder of vagina, unspecified: Secondary | ICD-10-CM

## 2012-02-26 DIAGNOSIS — N898 Other specified noninflammatory disorders of vagina: Secondary | ICD-10-CM

## 2012-02-26 DIAGNOSIS — R300 Dysuria: Secondary | ICD-10-CM | POA: Insufficient documentation

## 2012-02-26 DIAGNOSIS — N949 Unspecified condition associated with female genital organs and menstrual cycle: Secondary | ICD-10-CM | POA: Insufficient documentation

## 2012-02-26 HISTORY — DX: Urinary tract infection, site not specified: N39.0

## 2012-02-26 LAB — URINALYSIS, ROUTINE W REFLEX MICROSCOPIC
Bilirubin Urine: NEGATIVE
Glucose, UA: NEGATIVE mg/dL
Hgb urine dipstick: NEGATIVE
Ketones, ur: NEGATIVE mg/dL
Leukocytes, UA: NEGATIVE
Nitrite: NEGATIVE
Protein, ur: NEGATIVE mg/dL
Specific Gravity, Urine: >1.03 — ABNORMAL HIGH (ref 1.005–1.030)
Urobilinogen, UA: 0.2 mg/dL (ref 0.0–1.0)
pH: 5.5 (ref 5.0–8.0)

## 2012-02-26 LAB — WET PREP, GENITAL
Clue Cells Wet Prep HPF POC: NONE SEEN
Trich, Wet Prep: NONE SEEN
Yeast Wet Prep HPF POC: NONE SEEN

## 2012-02-26 LAB — POCT PREGNANCY, URINE: Preg Test, Ur: NEGATIVE

## 2012-02-26 MED ORDER — NYSTATIN-TRIAMCINOLONE 100000-0.1 UNIT/GM-% EX CREA: TOPICAL_CREAM | CUTANEOUS | Status: DC

## 2012-02-26 NOTE — MAU Provider Note (Signed)
History     CSN: [id number]  Arrival date and time: 02/26/12 1553   First Provider Initiated Contact with Patient 02/26/12 1701      Chief Complaint  Patient presents with  . Dysuria   HPI Crystal Gross is a 46 y.o. female who presents to MAU with painful urination. Symptoms started this morning and has continued. She rates her pain as 7/10 during urination. She describes the pain as burning in the vaginal area when the urine hits the skin. Used a different lubricant and condoms with last sexual intercourse and thinks may have caused irritation. The history was provided by the patient.  OB History    Grav Para Term Preterm Abortions TAB SAB Ect Mult Living   3 3 3       3       Past Medical History  Diagnosis Date  . Anxiety   . UTI (lower urinary tract infection)     Past Surgical History  Procedure Date  . Appendectomy     Family History  Problem Relation Age of Onset  . Diabetes Mother   . Hypertension Mother   . Stroke Mother   . Heart failure Father   . Migraines Father     History  Substance Use Topics  . Smoking status: Never Smoker   . Smokeless tobacco: Never Used  . Alcohol Use: No    Allergies: No Known Allergies  Prescriptions prior to admission  Medication Sig Dispense Refill  . Ascorbic Acid (VITAMIN C) 1000 MG tablet Take 1,000 mg by mouth daily.      . Multiple Vitamins-Iron (MULTIVITAMINS WITH IRON) TABS Take 1 tablet by mouth daily.         ROS Physical Exam   Blood pressure 116/81, pulse 100, temperature 97.2 F (36.2 C), temperature source Oral, resp. rate 18, last menstrual period 02/10/2012, SpO2 100.00%.  Physical Exam  Nursing note and vitals reviewed. Constitutional: She is oriented to person, place, and time. She appears well-developed and well-nourished. No distress.  HENT:  Head: Normocephalic and atraumatic.  Eyes: EOM are normal.  Neck: Neck supple.  Cardiovascular: Normal rate.   Respiratory: Effort normal.    GI: Soft. There is no tenderness.  Genitourinary:       External genitalia with erythema and irritation. White discharge vaginal vault. Cervix without lesions.  Musculoskeletal: Normal range of motion.  Neurological: She is alert and oriented to person, place, and time.  Skin: Skin is warm and dry.  Psychiatric: She has a normal mood and affect. Her behavior is normal. Judgment and thought content normal.    MAU Course  Procedures  MDM Results for orders placed during the hospital encounter of 02/26/12 (from the past 24 hour(s))  URINALYSIS, ROUTINE W REFLEX MICROSCOPIC     Status: Abnormal   Collection Time   02/26/12  4:20 PM      Component Value Range   Color, Urine YELLOW  YELLOW   APPearance CLEAR  CLEAR   Specific Gravity, Urine >1.030 (*) 1.005 - 1.030   pH 5.5  5.0 - 8.0   Glucose, UA NEGATIVE  NEGATIVE mg/dL   Hgb urine dipstick NEGATIVE  NEGATIVE   Bilirubin Urine NEGATIVE  NEGATIVE   Ketones, ur NEGATIVE  NEGATIVE mg/dL   Protein, ur NEGATIVE  NEGATIVE mg/dL   Urobilinogen, UA 0.2  0.0 - 1.0 mg/dL   Nitrite NEGATIVE  NEGATIVE   Leukocytes, UA NEGATIVE  NEGATIVE  POCT PREGNANCY, URINE  Status: Normal   Collection Time   02/26/12  4:33 PM      Component Value Range   Preg Test, Ur NEGATIVE  NEGATIVE  WET PREP, GENITAL     Status: Abnormal   Collection Time   02/26/12  5:10 PM      Component Value Range   Yeast Wet Prep HPF POC NONE SEEN  NONE SEEN   Trich, Wet Prep NONE SEEN  NONE SEEN   Clue Cells Wet Prep HPF POC NONE SEEN  NONE SEEN   WBC, Wet Prep HPF POC FEW (*) NONE SEEN   Assessment: 46 y.o. female with vaginal irritation  Plan:  Cultures for GC, Chlamydia and HSV sent      Medication List     As of 03/02/2012  8:16 PM    START taking these medications         nystatin-triamcinolone cream   Commonly known as: MYCOLOG II   Apply to affected area daily      CONTINUE taking these medications         multivitamins with iron Tabs       vitamin C 1000 MG tablet          Where to get your medications    These are the prescriptions that you need to pick up. We sent them to a specific pharmacy, so you will need to go there to get them.   [facility] [address] [place], [place] - [address] [facility] [address]    [address] [place] [address]    Phone: [phone]        nystatin-triamcinolone cream           Majesta Leichter, RN, FNP, [facility] 02/26/2012, 5:05 PM

## 2012-02-26 NOTE — MAU Note (Signed)
Patient is in with dysuria and vaginal irritation.

## 2012-02-27 LAB — GC/CHLAMYDIA PROBE AMP, GENITAL
Chlamydia, DNA Probe: NEGATIVE
GC Probe Amp, Genital: NEGATIVE

## 2012-02-28 ENCOUNTER — Telehealth (HOSPITAL_COMMUNITY): Payer: Self-pay

## 2012-02-28 LAB — HERPES SIMPLEX VIRUS CULTURE
Culture: NOT DETECTED
Special Requests: NORMAL

## 2012-03-10 NOTE — MAU Provider Note (Signed)
Attestation of Attending Supervision of Advanced Practitioner (CNM/NP): Evaluation and management procedures were performed by the Advanced Practitioner under my supervision and collaboration.  I have reviewed the Advanced Practitioner's note and chart, and I agree with the management and plan.  Sanjith Siwek 03/10/2012 10:51 AM

## 2012-08-20 ENCOUNTER — Emergency Department (HOSPITAL_COMMUNITY)
Admission: EM | Admit: 2012-08-20 | Discharge: 2012-08-20 | Disposition: A | Discharge status: Home / Self Care | Payer: Medicaid Other | Attending: Emergency Medicine | Admitting: Emergency Medicine

## 2012-08-20 ENCOUNTER — Encounter (HOSPITAL_COMMUNITY): Payer: Self-pay | Admitting: Emergency Medicine

## 2012-08-20 ENCOUNTER — Emergency Department (HOSPITAL_COMMUNITY): Payer: Medicaid Other

## 2012-08-20 DIAGNOSIS — R110 Nausea: Secondary | ICD-10-CM | POA: Insufficient documentation

## 2012-08-20 DIAGNOSIS — R109 Unspecified abdominal pain: Secondary | ICD-10-CM | POA: Insufficient documentation

## 2012-08-20 DIAGNOSIS — R3916 Straining to void: Secondary | ICD-10-CM | POA: Insufficient documentation

## 2012-08-20 DIAGNOSIS — Z87440 Personal history of urinary (tract) infections: Secondary | ICD-10-CM | POA: Insufficient documentation

## 2012-08-20 DIAGNOSIS — R300 Dysuria: Secondary | ICD-10-CM | POA: Insufficient documentation

## 2012-08-20 DIAGNOSIS — Z79899 Other long term (current) drug therapy: Secondary | ICD-10-CM | POA: Insufficient documentation

## 2012-08-20 DIAGNOSIS — N281 Cyst of kidney, acquired: Secondary | ICD-10-CM

## 2012-08-20 DIAGNOSIS — M549 Dorsalgia, unspecified: Secondary | ICD-10-CM

## 2012-08-20 DIAGNOSIS — R350 Frequency of micturition: Secondary | ICD-10-CM | POA: Insufficient documentation

## 2012-08-20 DIAGNOSIS — Z3202 Encounter for pregnancy test, result negative: Secondary | ICD-10-CM | POA: Insufficient documentation

## 2012-08-20 DIAGNOSIS — F411 Generalized anxiety disorder: Secondary | ICD-10-CM | POA: Insufficient documentation

## 2012-08-20 DIAGNOSIS — M545 Low back pain, unspecified: Secondary | ICD-10-CM | POA: Insufficient documentation

## 2012-08-20 DIAGNOSIS — IMO0002 Reserved for concepts with insufficient information to code with codable children: Secondary | ICD-10-CM | POA: Insufficient documentation

## 2012-08-20 LAB — URINALYSIS, ROUTINE W REFLEX MICROSCOPIC
Bilirubin Urine: NEGATIVE
Glucose, UA: NEGATIVE mg/dL
Hgb urine dipstick: NEGATIVE
Ketones, ur: NEGATIVE mg/dL
Leukocytes, UA: NEGATIVE
Nitrite: NEGATIVE
Protein, ur: NEGATIVE mg/dL
Specific Gravity, Urine: 1.03 (ref 1.005–1.030)
Urobilinogen, UA: 0.2 mg/dL (ref 0.0–1.0)
pH: 6 (ref 5.0–8.0)

## 2012-08-20 LAB — POCT I-STAT, CHEM 8
BUN: 14 mg/dL (ref 6–23)
Calcium, Ion: 1.23 mmol/L (ref 1.12–1.23)
Chloride: 104 mEq/L (ref 96–112)
Creatinine, Ser: 0.7 mg/dL (ref 0.50–1.10)
Glucose, Bld: 90 mg/dL (ref 70–99)
HCT: 35 % — ABNORMAL LOW (ref 36.0–46.0)
Hemoglobin: 11.9 g/dL — ABNORMAL LOW (ref 12.0–15.0)
Potassium: 4.3 mEq/L (ref 3.5–5.1)
Sodium: 139 mEq/L (ref 135–145)
TCO2: 27 mmol/L (ref 0–100)

## 2012-08-20 LAB — CBC WITH DIFFERENTIAL/PLATELET
Basophils Absolute: 0 10*3/uL (ref 0.0–0.1)
Basophils Relative: 1 % (ref 0–1)
Eosinophils Absolute: 0.1 10*3/uL (ref 0.0–0.7)
Eosinophils Relative: 1 % (ref 0–5)
HCT: 34.1 % — ABNORMAL LOW (ref 36.0–46.0)
Hemoglobin: 11.3 g/dL — ABNORMAL LOW (ref 12.0–15.0)
Lymphocytes Relative: 24 % (ref 12–46)
Lymphs Abs: 1.7 10*3/uL (ref 0.7–4.0)
MCH: 26.8 pg (ref 26.0–34.0)
MCHC: 33.1 g/dL (ref 30.0–36.0)
MCV: 80.8 fL (ref 78.0–100.0)
Monocytes Absolute: 0.5 10*3/uL (ref 0.1–1.0)
Monocytes Relative: 6 % (ref 3–12)
Neutro Abs: 4.9 10*3/uL (ref 1.7–7.7)
Neutrophils Relative %: 68 % (ref 43–77)
Platelets: 339 10*3/uL (ref 150–400)
RBC: 4.22 MIL/uL (ref 3.87–5.11)
RDW: 14.7 % (ref 11.5–15.5)
WBC: 7.2 10*3/uL (ref 4.0–10.5)

## 2012-08-20 LAB — POCT PREGNANCY, URINE: Preg Test, Ur: NEGATIVE

## 2012-08-20 NOTE — ED Notes (Signed)
PA at bedside.

## 2012-08-20 NOTE — ED Provider Notes (Signed)
History     CSN: [id number]  Arrival date & time 08/20/12  1018   First MD Initiated Contact with Patient 08/20/12 1022      Chief Complaint  Patient presents with  . Back Pain  . Dysuria    (Consider location/radiation/quality/duration/timing/severity/associated sxs/prior treatment) HPI Crystal Gross is a 47 y.o. female who presents to ED with complaint of bilateral flank pain and difficulty emptying her bladder. Stats back pain has been there several months. She had MR lumbar spine 3 wks ago, which showed unremarkable spine, but showed a foci lesion in kidney, possible renal cyst. [place] was recommended. Pt states she does not have PCP, and has not followed up. Became concerned because now when she urinates, she has to strain to empty her bladder. Denies pain with urination, no blood in her urine. States back pain worsened with movement and deep breaths. Denies fever, chills, chest pain, cough, abdominal pain. Has had some nausea, no vomiting.  Past Medical History  Diagnosis Date  . Anxiety   . UTI (lower urinary tract infection)     Past Surgical History  Procedure Laterality Date  . Appendectomy      Family History  Problem Relation Age of Onset  . Diabetes Mother   . Hypertension Mother   . Stroke Mother   . Heart failure Father   . Migraines Father     History  Substance Use Topics  . Smoking status: Never Smoker   . Smokeless tobacco: Never Used  . Alcohol Use: No    OB History   Grav Para Term Preterm Abortions TAB SAB Ect Mult Living   3 3 3       3       Review of Systems  Constitutional: Negative for fever and chills.  HENT: Negative for neck pain and neck stiffness.   Respiratory: Negative.   Cardiovascular: Negative.   Gastrointestinal: Positive for nausea. Negative for vomiting, abdominal pain, diarrhea, constipation and blood in stool.  Endocrine: Negative for polydipsia and polyuria.  Genitourinary: Positive for frequency, flank pain and  difficulty urinating. Negative for dysuria, hematuria and decreased urine volume.  Musculoskeletal: Positive for back pain.  Skin: Negative.   Neurological: Negative for weakness and numbness.    Allergies  Review of patient's allergies indicates no known allergies.  Home Medications   Current Outpatient Rx  Name  Route  Sig  Dispense  Refill  . Ascorbic Acid (VITAMIN C) 1000 MG tablet   Oral   Take 1,000 mg by mouth daily.         . Cholecalciferol (VITAMIN D3) 3000 UNITS TABS   Oral   Take 1 tablet by mouth daily.         . Multiple Vitamins-Iron (MULTIVITAMINS WITH IRON) TABS   Oral   Take 1 tablet by mouth daily.          Marland Kitchen nystatin-triamcinolone (MYCOLOG II) cream      Apply to affected area daily   15 g   0     BP 132/88  Pulse 100  Temp(Src) 98.3 F (36.8 C) (Oral)  Resp 19  SpO2 100%  LMP 08/11/2012  Physical Exam  Nursing note and vitals reviewed. Constitutional: She is oriented to person, place, and time. She appears well-developed and well-nourished. No distress.  HENT:  Head: Normocephalic.  Eyes: Conjunctivae are normal.  Neck: Neck supple.  Cardiovascular: Normal rate, regular rhythm and normal heart sounds.   Pulmonary/Chest: Effort normal and breath sounds  normal. No respiratory distress. She has no wheezes. She has no rales.  Abdominal: Soft. Bowel sounds are normal. She exhibits no distension. There is no tenderness. There is no rebound.  No CVA tenderness bilaterally  Musculoskeletal:  Entire spine non tender. No paraspinal muscle tenderness  Neurological: She is alert and oriented to person, place, and time.  Skin: Skin is warm and dry.    ED Course  Procedures (including critical care time)  Results for orders placed during the hospital encounter of 08/20/12  URINALYSIS, ROUTINE W REFLEX MICROSCOPIC      Result Value Range   Color, Urine YELLOW  YELLOW   APPearance CLEAR  CLEAR   Specific Gravity, Urine 1.030  1.005 - 1.030    pH 6.0  5.0 - 8.0   Glucose, UA NEGATIVE  NEGATIVE mg/dL   Hgb urine dipstick NEGATIVE  NEGATIVE   Bilirubin Urine NEGATIVE  NEGATIVE   Ketones, ur NEGATIVE  NEGATIVE mg/dL   Protein, ur NEGATIVE  NEGATIVE mg/dL   Urobilinogen, UA 0.2  0.0 - 1.0 mg/dL   Nitrite NEGATIVE  NEGATIVE   Leukocytes, UA NEGATIVE  NEGATIVE  CBC WITH DIFFERENTIAL      Result Value Range   WBC 7.2  4.0 - 10.5 K/uL   RBC 4.22  3.87 - 5.11 MIL/uL   Hemoglobin 11.3 (*) 12.0 - 15.0 g/dL   HCT 98.1 (*) 19.1 - 47.8 %   MCV 80.8  78.0 - 100.0 fL   MCH 26.8  26.0 - 34.0 pg   MCHC 33.1  30.0 - 36.0 g/dL   RDW 29.5  62.1 - 30.8 %   Platelets 339  150 - 400 K/uL   Neutrophils Relative 68  43 - 77 %   Neutro Abs 4.9  1.7 - 7.7 K/uL   Lymphocytes Relative 24  12 - 46 %   Lymphs Abs 1.7  0.7 - 4.0 K/uL   Monocytes Relative 6  3 - 12 %   Monocytes Absolute 0.5  0.1 - 1.0 K/uL   Eosinophils Relative 1  0 - 5 %   Eosinophils Absolute 0.1  0.0 - 0.7 K/uL   Basophils Relative 1  0 - 1 %   Basophils Absolute 0.0  0.0 - 0.1 K/uL  POCT PREGNANCY, URINE      Result Value Range   Preg Test, Ur NEGATIVE  NEGATIVE  POCT I-STAT, CHEM 8      Result Value Range   Sodium 139  135 - 145 mEq/L   Potassium 4.3  3.5 - 5.1 mEq/L   Chloride 104  96 - 112 mEq/L   BUN 14  6 - 23 mg/dL   Creatinine, Ser 6.57  0.50 - 1.10 mg/dL   Glucose, Bld 90  70 - 99 mg/dL   Calcium, Ion 8.46  9.62 - 1.23 mmol/L   TCO2 27  0 - 100 mmol/L   Hemoglobin 11.9 (*) 12.0 - 15.0 g/dL   HCT 95.2 (*) 84.1 - 32.4 %   US Renal  08/20/2012  *RADIOLOGY REPORT*  Clinical Data: 47 year old female with back pain, flank pain and dysuria.  RENAL/URINARY TRACT ULTRASOUND COMPLETE  Comparison:  None  Findings:  Right Kidney:  The right kidney is normal in echogenicity measuring 10.3 cm.  Punctate echogenic foci are identified likely representing nonobstructing renal calculi. There is no evidence of hydronephrosis, solid renal mass or definite renal calculi.  Left  Kidney:  The left kidney is normal in echogenicity measuring 11.5 cm.  A 2.5 x 3.4 cm simple cyst within the mid - upper left kidney is noted.  There is no evidence of hydronephrosis, solid renal mass or definite renal calculi.  Bladder:  The bladder is normal for degree of filling.  IMPRESSION: No evidence of acute abnormality.  Question punctate nonobstructing right renal calculi.   Original Report Authenticated By: Harmon Pier, M.D.       1. Back pain   2. Simple renal cyst       MDM  Pt with bilateral flank/lower back pain for several months. Had negative MRI except for a left renal cyst. Pt now concerned, given she also having some difficulty urinating. Labs all normal. UA negative. US renal obtained, showed questionable punctate non obstructing right renal calculi, doubt given did not show up on MRI. Also showed a simple left renal cyst. Discussed results with pt. She understands, will follow up. At this time, no fever, no vomiting. Vs normal. Stable for d/c home with outpatient follow up.   Filed Vitals:   08/20/12 1027 08/20/12 1338  BP: 132/88 113/78  Pulse: 100 98  Temp: 98.3 F (36.8 C) 98.3 F (36.8 C)  TempSrc: Oral Oral  Resp: 19 20  SpO2: 100% 100%         Lottie Mussel, PA-C 08/20/12 1753

## 2012-08-20 NOTE — Progress Notes (Signed)
During [facility] ED 08/18/12 visit CM spoke with pt who confirms self pay [facility] resident with no pcp. Confirms she was previously [facility] and has not seen another pcp since [facility] in August 2013 EPIC updated CM discussed and provided written information for self pay pcps, importance of pcp for f/u care, www.needymeds.org, discounted pharmacies, MATCH program and other [place] resources such as financial assistance, DSS and  health department Reviewed Health connect number to assist with finding self pay provider close to pt's residence. Reviewed resources for [place] self pay pcps like [facility], family medicine at [address], [facility] family practice, general medical clinics, [facility] urgent care plus others, [facility] out patient [facility]ies, housing, affordable care act/health reform (deadline 09/01/12) and other resources in [employer]. Pt voiced understanding and appreciation of resources provided   Confirms she has not signed up through affordable care act marketplace and states she will make the attempt to complete process

## 2012-08-20 NOTE — ED Notes (Signed)
Pt alert and oriented x4. Respirations even and unlabored, bilateral symmetrical rise and fall of chest. Skin warm and dry. In no acute distress. Denies needs.   

## 2012-08-22 NOTE — ED Provider Notes (Signed)
Medical screening examination/treatment/procedure(s) were performed by non-physician practitioner and as supervising physician I was immediately available for consultation/collaboration.   Calysta Craigo L Jovi Alvizo, MD 08/22/12 1429 

## 2012-10-06 ENCOUNTER — Ambulatory Visit: Payer: Self-pay | Admitting: Family Medicine

## 2012-11-10 ENCOUNTER — Ambulatory Visit: Payer: Self-pay | Admitting: Family Medicine

## 2014-02-14 ENCOUNTER — Encounter (HOSPITAL_COMMUNITY): Payer: Self-pay | Admitting: Emergency Medicine

## 2014-02-14 ENCOUNTER — Emergency Department (HOSPITAL_COMMUNITY): Payer: Self-pay

## 2014-02-14 ENCOUNTER — Emergency Department (HOSPITAL_COMMUNITY)
Admission: EM | Admit: 2014-02-14 | Discharge: 2014-02-14 | Disposition: A | Discharge status: Home / Self Care | Payer: Self-pay | Attending: Emergency Medicine | Admitting: Emergency Medicine

## 2014-02-14 DIAGNOSIS — M79602 Pain in left arm: Secondary | ICD-10-CM

## 2014-02-14 DIAGNOSIS — Z79899 Other long term (current) drug therapy: Secondary | ICD-10-CM | POA: Insufficient documentation

## 2014-02-14 DIAGNOSIS — M79609 Pain in unspecified limb: Secondary | ICD-10-CM | POA: Insufficient documentation

## 2014-02-14 MED ORDER — METHOCARBAMOL 500 MG PO TABS: 500.0000 mg | ORAL_TABLET | Freq: Two times a day (BID) | ORAL | Status: DC

## 2014-02-14 MED ORDER — IBUPROFEN 800 MG PO TABS: 800.0000 mg | ORAL_TABLET | Freq: Three times a day (TID) | ORAL | Status: DC

## 2014-02-14 NOTE — ED Provider Notes (Signed)
Medical screening examination/treatment/procedure(s) were performed by non-physician practitioner and as supervising physician I was immediately available for consultation/collaboration.   Wynona Duhamel T Jashan Cotten, MD 02/14/14 1538 

## 2014-02-14 NOTE — ED Provider Notes (Signed)
CSN: [id number]     Arrival date & time 02/14/14  1478 History   First MD Initiated Contact with Patient 02/14/14 1010     Chief Complaint  Patient presents with  . Arm Pain     (Consider location/radiation/quality/duration/timing/severity/associated sxs/prior Treatment) HPI Comments: Patient is a 48 year old female presenting to the emergency for 3 years of left arm pain since receiving her flu shot. Patient states she has had spasm-like pain and swelling to the arm since then. She states it feels like the needle is still in there. Patient has been evaluated multiple times by her primary care physician including a recent x-ray 4 weeks ago without evidence of any foreign body. Patient states she'll take occasional Tylenol for any discomfort but states, "I don't like taking medications." She was advised to follow up with an orthopedist by her PCP but has not done so d/t financial restrictions.   Patient is a 48 y.o. female presenting with arm pain.  Arm Pain Associated symptoms include arthralgias and myalgias. Pertinent negatives include no chills or fever.    History reviewed. No pertinent past medical history. Past Surgical History  Procedure Laterality Date  . Appendectomy     No family history on file. History  Substance Use Topics  . Smoking status: Never Smoker   . Smokeless tobacco: Not on file  . Alcohol Use: No   OB History   Grav Para Term Preterm Abortions TAB SAB Ect Mult Living                 Review of Systems  Constitutional: Negative for fever and chills.  Musculoskeletal: Positive for arthralgias and myalgias.  All other systems reviewed and are negative.     Allergies  Review of patient's allergies indicates no known allergies.  Home Medications   Prior to Admission medications   Medication Sig Start Date End Date Taking? Authorizing Provider  acetaminophen (TYLENOL) 500 MG tablet Take 500 mg by mouth every 6 (six) hours as needed for mild pain.    Yes Historical Provider, MD  Ascorbic Acid (VITAMIN C) 1000 MG tablet Take 1,000 mg by mouth daily.   Yes Historical Provider, MD  B Complex-C (B-COMPLEX WITH VITAMIN C) tablet Take 1 tablet by mouth daily.   Yes Historical Provider, MD  Cranberry 200 MG CAPS Take 200 mg by mouth daily.   Yes Historical Provider, MD  Multiple Vitamin (MULTIVITAMIN WITH MINERALS) TABS tablet Take 1 tablet by mouth daily.   Yes Historical Provider, MD  ibuprofen (ADVIL,MOTRIN) 800 MG tablet Take 1 tablet (800 mg total) by mouth 3 (three) times daily. 02/14/14   Erynn Vaca L Atley Neubert, PA-C  methocarbamol (ROBAXIN) 500 MG tablet Take 1 tablet (500 mg total) by mouth 2 (two) times daily. 02/14/14   Hektor Huston L Gevin Perea, PA-C   BP 110/68  Pulse 86  Temp(Src) 97.9 F (36.6 C) (Oral)  Resp 18  SpO2 100%  LMP 01/20/2014 Physical Exam  Nursing note and vitals reviewed. Constitutional: She is oriented to person, place, and time. She appears well-developed and well-nourished. No distress.  HENT:  Head: Normocephalic and atraumatic.  Right Ear: External ear normal.  Left Ear: External ear normal.  Nose: Nose normal.  Eyes: Conjunctivae are normal.  Neck: Neck supple.  Cardiovascular: Normal rate, regular rhythm, normal heart sounds and intact distal pulses.   Pulmonary/Chest: Effort normal and breath sounds normal.  Abdominal: Soft. There is no tenderness.  Musculoskeletal: Normal range of motion. She exhibits no edema and no  tenderness.  Negative Empty Can Test Negative Adson's maneuver ROM intact with Apley Scratch Test No upper or lower extremity swelling, pallor, rash, erythema, or warmth noted.   Neurological: She is alert and oriented to person, place, and time. GCS eye subscore is 4. GCS verbal subscore is 5. GCS motor subscore is 6.  Skin: Skin is warm and dry. She is not diaphoretic.    ED Course  Procedures (including critical care time) Medications - No data to display  Labs Review Labs  Reviewed - No data to display  Imaging Review No results found.   EKG Interpretation None      Patient declines pain medications and X-ray.   MDM   Final diagnoses:  Left arm pain    Filed Vitals:   02/14/14 1006  BP: 110/68  Pulse: 86  Temp: 97.9 F (36.6 C)  Resp: 18   Afebrile, NAD, non-toxic appearing, AAOx4.   Neurovascularly intact. Normal sensation. No left UE swelling, erythema, warmth noted. ROM intact. No FB appreciated. Patient with multiple negative x-rays per her at multiple PCP visits, declines any x-rays today. Symptoms like musculoskeletal in nature. Declined any pain medications in ED. Advised PCP and ortho f/u. Return precautions discussed. Patient is agreeable to plan. Patient is stable at time of discharge      Jeannetta Ellis, PA-C 02/14/14 1307

## 2014-02-14 NOTE — Discharge Instructions (Signed)
Please follow up with your primary care physician in 1-2 days. If you do not have one please call the [facility] number listed above. Please take pain medication and/or muscle relaxants as prescribed and as needed for pain. Please do not drive on narcotic pain medication or on muscle relaxants. Please read all discharge instructions and return precautions.   Muscle Pain Muscle pain (myalgia) may be caused by many things, including:  Overuse or muscle strain, especially if you are not in shape. This is the most common cause of muscle pain.  Injury.  Bruises.  Viruses, such as the flu.  Infectious diseases.  Fibromyalgia, which is a chronic condition that causes muscle tenderness, fatigue, and headache.  Autoimmune diseases, including lupus.  Certain drugs, including ACE inhibitors and statins. Muscle pain may be mild or severe. In most cases, the pain lasts only a short time and goes away without treatment. To diagnose the cause of your muscle pain, your health care provider will take your medical history. This means he or she will ask you when your muscle pain began and what has been happening. If you have not had muscle pain for very long, your health care provider may want to wait before doing much testing. If your muscle pain has lasted a long time, your health care provider may want to run tests right away. If your health care provider thinks your muscle pain may be caused by illness, you may need to have additional tests to rule out certain conditions.  Treatment for muscle pain depends on the cause. Home care is often enough to relieve muscle pain. Your health care provider may also prescribe anti-inflammatory medicine. HOME CARE INSTRUCTIONS Watch your condition for any changes. The following actions may help to lessen any discomfort you are feeling:  Only take over-the-counter or prescription medicines as directed by your health care provider.  Apply ice to the  sore muscle:  Put ice in a plastic bag.  Place a towel between your skin and the bag.  Leave the ice on for 15-20 minutes, 3-4 times a day.  You may alternate applying hot and cold packs to the muscle as directed by your health care provider.  If overuse is causing your muscle pain, slow down your activities until the pain goes away.  Remember that it is normal to feel some muscle pain after starting a workout program. Muscles that have not been used often will be sore at first.  Do regular, gentle exercises if you are not usually active.  Warm up before exercising to lower your risk of muscle pain.  Do not continue working out if the pain is very bad. Bad pain could mean you have injured a muscle. SEEK MEDICAL CARE IF:  Your muscle pain gets worse, and medicines do not help.  You have muscle pain that lasts longer than 3 days.  You have a rash or fever along with muscle pain.  You have muscle pain after a tick bite.  You have muscle pain while working out, even though you are in good physical condition.  You have redness, soreness, or swelling along with muscle pain.  You have muscle pain after starting a new medicine or changing the dose of a medicine. SEEK IMMEDIATE MEDICAL CARE IF:  You have trouble breathing.  You have trouble swallowing.  You have muscle pain along with a stiff neck, fever, and vomiting.  You have severe muscle weakness or cannot move part of your body.  MAKE SURE YOU:   Understand these instructions.  Will watch your condition.  Will get help right away if you are not doing well or get worse. Document Released: 04/12/2006 Document Revised: 05/26/2013 Document Reviewed: 03/17/2013 [facility] Patient Information 2015 [place], [place]. This information is not intended to replace advice given to you by your health care provider. Make sure you discuss any questions you have with your health care provider.

## 2014-02-14 NOTE — ED Notes (Signed)
Pt sts 3 years ago received flu shot in her left arm and ever since she is having pain and swelling in the arm; sts "feels like needle is still in there".

## 2014-02-25 ENCOUNTER — Other Ambulatory Visit: Payer: Self-pay | Admitting: Orthopaedic Surgery

## 2014-02-25 DIAGNOSIS — M25512 Pain in left shoulder: Secondary | ICD-10-CM

## 2014-03-01 ENCOUNTER — Other Ambulatory Visit (HOSPITAL_COMMUNITY): Payer: Self-pay | Admitting: Primary Care

## 2014-03-01 DIAGNOSIS — Z1231 Encounter for screening mammogram for malignant neoplasm of breast: Secondary | ICD-10-CM

## 2014-03-04 ENCOUNTER — Encounter (HOSPITAL_COMMUNITY): Payer: Self-pay | Admitting: Emergency Medicine

## 2014-03-07 ENCOUNTER — Ambulatory Visit
Admission: RE | Admit: 2014-03-07 | Discharge: 2014-03-07 | Disposition: A | Discharge status: Home / Self Care | Payer: No Typology Code available for payment source | Source: Ambulatory Visit | Attending: Orthopaedic Surgery | Admitting: Orthopaedic Surgery

## 2014-03-07 DIAGNOSIS — M25512 Pain in left shoulder: Secondary | ICD-10-CM

## 2014-04-05 ENCOUNTER — Encounter (HOSPITAL_COMMUNITY): Payer: Self-pay | Admitting: Emergency Medicine

## 2014-04-21 ENCOUNTER — Ambulatory Visit (HOSPITAL_COMMUNITY)
Admission: RE | Admit: 2014-04-21 | Discharge: 2014-04-21 | Disposition: A | Discharge status: Home / Self Care | Payer: No Typology Code available for payment source | Source: Ambulatory Visit | Attending: Primary Care | Admitting: Primary Care

## 2014-04-21 DIAGNOSIS — Z1231 Encounter for screening mammogram for malignant neoplasm of breast: Secondary | ICD-10-CM | POA: Insufficient documentation

## 2015-09-22 ENCOUNTER — Emergency Department (HOSPITAL_COMMUNITY)
Admission: EM | Admit: 2015-09-22 | Discharge: 2015-09-22 | Disposition: A | Discharge status: Home / Self Care | Payer: No Typology Code available for payment source | Attending: Emergency Medicine | Admitting: Emergency Medicine

## 2015-09-22 ENCOUNTER — Encounter (HOSPITAL_COMMUNITY): Payer: Self-pay

## 2015-09-22 DIAGNOSIS — Z79899 Other long term (current) drug therapy: Secondary | ICD-10-CM | POA: Insufficient documentation

## 2015-09-22 DIAGNOSIS — R350 Frequency of micturition: Secondary | ICD-10-CM | POA: Insufficient documentation

## 2015-09-22 DIAGNOSIS — Z87440 Personal history of urinary (tract) infections: Secondary | ICD-10-CM | POA: Insufficient documentation

## 2015-09-22 DIAGNOSIS — E041 Nontoxic single thyroid nodule: Secondary | ICD-10-CM

## 2015-09-22 DIAGNOSIS — Z8659 Personal history of other mental and behavioral disorders: Secondary | ICD-10-CM | POA: Insufficient documentation

## 2015-09-22 LAB — I-STAT CHEM 8, ED
BUN: 23 mg/dL — ABNORMAL HIGH (ref 6–20)
Calcium, Ion: 1.24 mmol/L — ABNORMAL HIGH (ref 1.12–1.23)
Chloride: 103 mmol/L (ref 101–111)
Creatinine, Ser: 0.6 mg/dL (ref 0.44–1.00)
Glucose, Bld: 79 mg/dL (ref 65–99)
HCT: 40 % (ref 36.0–46.0)
Hemoglobin: 13.6 g/dL (ref 12.0–15.0)
Potassium: 4.6 mmol/L (ref 3.5–5.1)
Sodium: 139 mmol/L (ref 135–145)
TCO2: 30 mmol/L (ref 0–100)

## 2015-09-22 LAB — URINALYSIS, ROUTINE W REFLEX MICROSCOPIC
Bilirubin Urine: NEGATIVE
Glucose, UA: NEGATIVE mg/dL
Hgb urine dipstick: NEGATIVE
Ketones, ur: NEGATIVE mg/dL
Nitrite: NEGATIVE
Protein, ur: NEGATIVE mg/dL
Specific Gravity, Urine: 1.025 (ref 1.005–1.030)
pH: 7 (ref 5.0–8.0)

## 2015-09-22 LAB — TSH: TSH: 2.527 u[IU]/mL (ref 0.350–4.500)

## 2015-09-22 LAB — URINE MICROSCOPIC-ADD ON

## 2015-09-22 LAB — T4, FREE: Free T4: 0.99 ng/dL (ref 0.61–1.12)

## 2015-09-22 LAB — CBG MONITORING, ED: Glucose-Capillary: 88 mg/dL (ref 65–99)

## 2015-09-22 NOTE — ED Notes (Signed)
Pt here with knot in neck and trouble swallowing.  Pt states it has started x 2-3 days.  Also feels weight in her back and increased in her neck when bending over.

## 2015-09-22 NOTE — ED Provider Notes (Signed)
CSN: [id number]     Arrival date & time 09/22/15  1329 History   First MD Initiated Contact with Patient 09/22/15 1506     Chief Complaint  Patient presents with  . Neck Pain  . Urinary Frequency     (Consider location/radiation/quality/duration/timing/severity/associated sxs/prior Treatment) Patient is a 50 y.o. female presenting with neck pain and frequency.  Neck Pain Pain location:  L side Quality: feels like "someone is blowing air and something moves", gets lightheaded, and then resolves spontaneously. Pain radiates to:  Does not radiate Pain severity:  No pain Onset quality:  Gradual Timing:  Sporadic Progression:  Unchanged Chronicity:  New Relieved by:  Nothing Worsened by:  Nothing tried Ineffective treatments:  None tried Associated symptoms: no chest pain and no fever   Urinary Frequency This is a new problem. Episode onset: 2 weeks ago. The problem occurs constantly (8x/day with feeling of incomplete emptying). The problem has not changed since onset.Pertinent negatives include no chest pain. Nothing aggravates the symptoms. Nothing relieves the symptoms. She has tried nothing for the symptoms.    Past Medical History  Diagnosis Date  . Anxiety   . UTI (lower urinary tract infection)    Past Surgical History  Procedure Laterality Date  . Appendectomy     Family History  Problem Relation Age of Onset  . Diabetes Mother   . Hypertension Mother   . Stroke Mother   . Heart failure Father   . Migraines Father    Social History  Substance Use Topics  . Smoking status: Never Smoker   . Smokeless tobacco: None  . Alcohol Use: No   OB History    Gravida Para Term Preterm AB TAB SAB Ectopic Multiple Living   3 3 3       3      Review of Systems  Constitutional: Negative for fever.  Cardiovascular: Negative for chest pain.  Genitourinary: Positive for frequency.  Musculoskeletal: Positive for neck pain.  All other systems reviewed and are  negative.     Allergies  Review of patient's allergies indicates no known allergies.  Home Medications   Prior to Admission medications   Medication Sig Start Date End Date Taking? Authorizing Provider  acetaminophen (TYLENOL) 500 MG tablet Take 500 mg by mouth every 6 (six) hours as needed for mild pain.   Yes Historical Provider, MD  Ascorbic Acid (VITAMIN C) 1000 MG tablet Take 1,000 mg by mouth 3 (three) times a week.    Yes Historical Provider, MD  B Complex-C (B-COMPLEX WITH VITAMIN C) tablet Take 1 tablet by mouth 3 (three) times a week.    Yes Historical Provider, MD  Collagen 500 MG CAPS Take 1 each by mouth daily. Chews.   Yes Historical Provider, MD  Multiple Vitamin (MULTIVITAMIN WITH MINERALS) TABS tablet Take 1 tablet by mouth daily.   Yes Historical Provider, MD  polyvinyl alcohol (LIQUIFILM TEARS) 1.4 % ophthalmic solution Place 1 drop into both eyes daily.   Yes Historical Provider, MD  ibuprofen (ADVIL,MOTRIN) 800 MG tablet Take 1 tablet (800 mg total) by mouth 3 (three) times daily. Patient not taking: Reported on 09/22/2015 02/14/14   Francee PiccoloJennifer Piepenbrink, PA-C  methocarbamol (ROBAXIN) 500 MG tablet Take 1 tablet (500 mg total) by mouth 2 (two) times daily. Patient not taking: Reported on 09/22/2015 02/14/14   Francee PiccoloJennifer Piepenbrink, PA-C  nystatin-triamcinolone [facility](MYCOLOG II) cream Apply to affected area daily Patient not taking: Reported on 09/22/2015 02/26/12   Janne NapoleonHope M Neese, NP  BP 113/80 mmHg  Pulse 97  Temp(Src) 98.3 F (36.8 C) (Oral)  Resp 19  Ht  (1.626 m)  Wt 156 lb 6 oz (70.931 kg)  BMI 26.83 kg/m2  SpO2 99%  LMP 12/20/2013 (Approximate) Physical Exam  Constitutional: She is oriented to person, place, and time. She appears well-developed and well-nourished. No distress.  HENT:  Head: Normocephalic.  Eyes: Conjunctivae are normal.  Neck: Trachea normal. Neck supple. No tracheal deviation present. Thyroid mass (<1cm left lower lobe mobile, nontender  nodule) present.  Cardiovascular: Normal rate, regular rhythm and normal heart sounds.   Pulmonary/Chest: Effort normal and breath sounds normal. No respiratory distress.  Abdominal: Soft. She exhibits no distension. There is no tenderness.  Neurological: She is alert and oriented to person, place, and time.  Skin: Skin is warm and dry.  Psychiatric: She has a normal mood and affect.    ED Course  Procedures (including critical care time) Labs Review Labs Reviewed  URINALYSIS, ROUTINE W REFLEX MICROSCOPIC (NOT AT [facility]) - Abnormal; Notable for the following:    APPearance CLOUDY (*)    Leukocytes, UA SMALL (*)    All other components within normal limits  URINE MICROSCOPIC-ADD ON - Abnormal; Notable for the following:    Squamous Epithelial / LPF 0-5 (*)    Bacteria, UA MANY (*)    All other components within normal limits  I-STAT CHEM 8, ED - Abnormal; Notable for the following:    BUN 23 (*)    Calcium, Ion 1.24 (*)    All other components within normal limits  URINE CULTURE  TSH  T4, FREE  CBG MONITORING, ED    Imaging Review No results found. I have personally reviewed and evaluated these images and lab results as part of my medical decision-making.   EKG Interpretation None      MDM   Final diagnoses:  Solitary thyroid nodule    50 y.o. female presents with A question of a lump in her neck on the left side that she noticed over the last few days. She also is experiencing some anxiety symptoms of brief episodes of losing her breath which stopped spontaneously. Clinical exam is consistent with a small left lower lobe thyroid nodule. TSH and free T4 were performed to screen for hyper or hypo-thyroidism with planned follow-up for outpatient workup of the nodule with ultrasonography under the care of her primary care provider. Patient has secondary complaints of urinary frequency and low back pain. Her kidney function is appropriate and her UA shows no signs of infection  so we will defer further evaluation to outpatient setting.  Would treat if urine culture returns positive but low clinical suspicion. Pt reassured and feeling better after completion of labs. Plan to follow up with PCP as needed and return precautions discussed for worsening or new concerning symptoms.     Lyndal Pulley, MD 09/23/15 [phone]

## 2015-09-22 NOTE — ED Notes (Signed)
Patient states she feels as if her blood sugar was low. This RN checked patient's blood glucose level.

## 2015-09-22 NOTE — Discharge Instructions (Signed)
Thyroid Nodule A thyroid nodule is an isolatedgrowth of thyroid cells that forms a lump in your thyroid gland. The thyroid gland is a butterfly-shaped gland. It is found in the lower front of your neck. This gland sends chemical messengers (hormones) through your blood to all parts of your body. These hormones are important in regulating your body temperature and helping your body to use energy. Thyroid nodules are common. Most are not cancerous (are benign). You may have one nodule or several nodules.  Different types of thyroid nodules include:  Nodules that grow and fill with fluid (thyroid cysts).  Nodules that produce too much thyroid hormone (hot nodules or hyperthyroid).  Nodules that produce no thyroid hormone (cold nodules or hypothyroid).  Nodules that form from cancer cells (thyroid cancers). CAUSES Usually, the cause of this condition is not known. RISK FACTORS Factors that make this condition more likely to develop include:  Increasing age. Thyroid nodules become more common in people who are older than 52 years of age.  Gender.  Benign thyroid nodules are more common in women.  Cancerous (malignant) thyroid nodules are more common in men.  A family history that includes:  Thyroid nodules.  Pheochromocytoma.  Thyroid carcinoma.  Hyperparathyroidism.  Certain kinds of thyroid diseases, such as Hashimoto thyroiditis.  Lack of iodine.  A history of head and neck radiation, such as from X-rays. SYMPTOMS It is common for this condition to cause no symptoms. If you have symptoms, they may include:  A lump in your lower neck.  Feeling a lump or tickle in your throat.  Pain in your neck, jaw, or ear.  Having trouble swallowing. Hot nodules may cause symptoms that include:  Weight loss.  Warm, flushed skin.  Feeling hot.  Feeling nervous.  A racing heartbeat. Cold nodules may cause symptoms that include:  Weight gain.  Dry skin.  Brittle hair.  This may also occur with hair loss.  Feeling cold.  Fatigue. Thyroid cancer nodules may cause symptoms that include:  Hard nodules that feel stuck to the thyroid gland.  Hoarseness.  Lumps in the glands near your thyroid (lymph nodes). DIAGNOSIS A thyroid nodule may be felt by your health care provider during a physical exam. This condition may also be diagnosed based on your symptoms. You may also have tests, including:  An ultrasound. This may be done to confirm the diagnosis.  A biopsy. This involves taking a sample from the nodule and looking at it under a microscope to see if the nodule is benign.  Blood tests to make sure that your thyroid is working properly.  Imaging tests such as MRI or CT scan may be done if:  Your nodule is large.  Your nodule is blocking your airway.  Cancer is suspected. TREATMENT Treatment depends on the cause and size of your nodule or nodules. If the nodule is benign, treatment may not be necessary. Your health care provider may monitor the nodule to see if it goes away without treatment. If the nodule continues to grow, is cancerous, or does not go away:  It may need to be drained with a needle.  It may need to be removed with surgery. If you have surgery, part or all of your thyroid gland may need to be removed as well. HOME CARE INSTRUCTIONS  Pay attention to any changes in your nodule.  Take over-the-counter and prescription medicines only as told by your health care provider.  Keep all follow-up visits as told by your health   care provider. This is important. SEEK MEDICAL CARE IF:  Your voice changes.  You have trouble swallowing.  You have pain in your neck, ear, or jaw that is getting worse.  Your nodule gets bigger.  Your nodule starts to make it harder for you to breathe. SEEK IMMEDIATE MEDICAL CARE IF:  You have a sudden fever.  You feel very weak.  Your muscles look like they are shrinking (muscle wasting).  You  have mood swings.  You feel very restless.  You feel confused.  You are seeing or hearing things that other people do not see or hear (having hallucinations).  You feel suddenly nauseous or throw up.  You suddenly have diarrhea.  You have chest pain.  There is a loss of consciousness.   This information is not intended to replace advice given to you by your health care provider. Make sure you discuss any questions you have with your health care provider.   Document Released: 04/13/2004 Document Revised: 02/09/2015 Document Reviewed: 09/01/2014 Elsevier Interactive Patient Education 2016 [employer]  

## 2015-09-23 ENCOUNTER — Emergency Department (HOSPITAL_BASED_OUTPATIENT_CLINIC_OR_DEPARTMENT_OTHER)
Admission: EM | Admit: 2015-09-23 | Discharge: 2015-09-23 | Disposition: A | Discharge status: Home / Self Care | Payer: Self-pay | Attending: Emergency Medicine | Admitting: Emergency Medicine

## 2015-09-23 ENCOUNTER — Emergency Department (HOSPITAL_BASED_OUTPATIENT_CLINIC_OR_DEPARTMENT_OTHER): Payer: Self-pay

## 2015-09-23 ENCOUNTER — Encounter (HOSPITAL_BASED_OUTPATIENT_CLINIC_OR_DEPARTMENT_OTHER): Payer: Self-pay | Admitting: *Deleted

## 2015-09-23 DIAGNOSIS — E0590 Thyrotoxicosis, unspecified without thyrotoxic crisis or storm: Secondary | ICD-10-CM | POA: Insufficient documentation

## 2015-09-23 DIAGNOSIS — Z79899 Other long term (current) drug therapy: Secondary | ICD-10-CM | POA: Insufficient documentation

## 2015-09-23 DIAGNOSIS — E041 Nontoxic single thyroid nodule: Secondary | ICD-10-CM

## 2015-09-23 DIAGNOSIS — Z87440 Personal history of urinary (tract) infections: Secondary | ICD-10-CM | POA: Insufficient documentation

## 2015-09-23 DIAGNOSIS — F419 Anxiety disorder, unspecified: Secondary | ICD-10-CM | POA: Insufficient documentation

## 2015-09-23 MED ORDER — SODIUM CHLORIDE 0.9 % IV SOLN
INTRAVENOUS | Status: DC
  Administered 2015-09-23: 15:00:00 via INTRAVENOUS

## 2015-09-23 MED ORDER — IOPAMIDOL (ISOVUE-300) INJECTION 61%
75.0000 mL | Freq: Once | INTRAVENOUS | Status: AC | PRN
Start: 2015-09-23 — End: 2015-09-23
  Administered 2015-09-23: 75 mL via INTRAVENOUS

## 2015-09-23 NOTE — ED Provider Notes (Signed)
CT scan results reviewed, and discussed at length with patient. She is clearly quite anxious. She has a series of questions, some repetitive reactive tried my best to reassure her that per her CT scans are no obvious malignant characteristics. Some mildly enlarged lymph nodes, and doubling of the size since 2012. I discussed with her following up with an ENT physician to discuss either watchful waiting, or biopsy based on the opinion of the ENT physician. She can press his understanding this. Was given referral to our on-call ENT physician. Was given a copy of her CT report.  Crystal PorterMark Tehillah Cipriani, MD 09/23/15 [phone]

## 2015-09-23 NOTE — Discharge Instructions (Signed)
Your CT scan shows a nodule in your thyroid gland. You have been referred to an ENT doctor to discuss further evalaution and treatment of this nodule.    Thyroid Nodule A thyroid nodule is an isolatedgrowth of thyroid cells that forms a lump in your thyroid gland. The thyroid gland is a butterfly-shaped gland. It is found in the lower front of your neck. This gland sends chemical messengers (hormones) through your blood to all parts of your body. These hormones are important in regulating your body temperature and helping your body to use energy. Thyroid nodules are common. Most are not cancerous (are benign). You may have one nodule or several nodules.  Different types of thyroid nodules include:  Nodules that grow and fill with fluid (thyroid cysts).  Nodules that produce too much thyroid hormone (hot nodules or hyperthyroid).  Nodules that produce no thyroid hormone (cold nodules or hypothyroid).  Nodules that form from cancer cells (thyroid cancers). CAUSES Usually, the cause of this condition is not known. RISK FACTORS Factors that make this condition more likely to develop include:  Increasing age. Thyroid nodules become more common in people who are older than 52 years of age.  Gender.  Benign thyroid nodules are more common in women.  Cancerous (malignant) thyroid nodules are more common in men.  A family history that includes:  Thyroid nodules.  Pheochromocytoma.  Thyroid carcinoma.  Hyperparathyroidism.  Certain kinds of thyroid diseases, such as Hashimoto thyroiditis.  Lack of iodine.  A history of head and neck radiation, such as from X-rays. SYMPTOMS It is common for this condition to cause no symptoms. If you have symptoms, they may include:  A lump in your lower neck.  Feeling a lump or tickle in your throat.  Pain in your neck, jaw, or ear.  Having trouble swallowing. Hot nodules may cause symptoms that include:  Weight loss.  Warm, flushed  skin.  Feeling hot.  Feeling nervous.  A racing heartbeat. Cold nodules may cause symptoms that include:  Weight gain.  Dry skin.  Brittle hair. This may also occur with hair loss.  Feeling cold.  Fatigue. Thyroid cancer nodules may cause symptoms that include:  Hard nodules that feel stuck to the thyroid gland.  Hoarseness.  Lumps in the glands near your thyroid (lymph nodes). DIAGNOSIS A thyroid nodule may be felt by your health care provider during a physical exam. This condition may also be diagnosed based on your symptoms. You may also have tests, including:  An ultrasound. This may be done to confirm the diagnosis.  A biopsy. This involves taking a sample from the nodule and looking at it under a microscope to see if the nodule is benign.  Blood tests to make sure that your thyroid is working properly.  Imaging tests such as MRI or CT scan may be done if:  Your nodule is large.  Your nodule is blocking your airway.  Cancer is suspected. TREATMENT Treatment depends on the cause and size of your nodule or nodules. If the nodule is benign, treatment may not be necessary. Your health care provider may monitor the nodule to see if it goes away without treatment. If the nodule continues to grow, is cancerous, or does not go away:  It may need to be drained with a needle.  It may need to be removed with surgery. If you have surgery, part or all of your thyroid gland may need to be removed as well. HOME CARE INSTRUCTIONS  Pay attention  to any changes in your nodule.  Take over-the-counter and prescription medicines only as told by your health care provider.  Keep all follow-up visits as told by your health care provider. This is important. SEEK MEDICAL CARE IF:  Your voice changes.  You have trouble swallowing.  You have pain in your neck, ear, or jaw that is getting worse.  Your nodule gets bigger.  Your nodule starts to make it harder for you to  breathe. SEEK IMMEDIATE MEDICAL CARE IF:  You have a sudden fever.  You feel very weak.  Your muscles look like they are shrinking (muscle wasting).  You have mood swings.  You feel very restless.  You feel confused.  You are seeing or hearing things that other people do not see or hear (having hallucinations).  You feel suddenly nauseous or throw up.  You suddenly have diarrhea.  You have chest pain.  There is a loss of consciousness.   This information is not intended to replace advice given to you by your health care provider. Make sure you discuss any questions you have with your health care provider.   Document Released: 04/13/2004 Document Revised: 02/09/2015 Document Reviewed: 09/01/2014 Elsevier Interactive Patient Education [employer]

## 2015-09-23 NOTE — ED Provider Notes (Signed)
CSN: [id number]     Arrival date & time 09/23/15  1350 History   First MD Initiated Contact with Patient 09/23/15 1430     Chief Complaint  Patient presents with  . Neck Pain   HPI Patient presents to the emergency room for complaints of neck discomfort. Patient had the symptoms for a couple weeks. She feels a sensation of something on the left side of her throat. She feels like it's pressing on her airway. She notices it more when she swallows. She has not had any trouble eating or drinking. She is not feeling short of breath at rest. Patient went to the emergency room yesterday and had an evaluation that included a urinalysis for some urinary frequency issues, and i-STAT chem 8, and thyroid studies. The patient is still having the symptoms today. She was concerned and decided to come to the emergency room.  She denies fevers or chills. No vomiting or diarrhea. No chest pain although she has noted some intermittent episodes of what sounds like PVCs. Patient has an episode where she feels like there is a jumping in her chest and her breath stops momentarily. These episodes last less than a second Past Medical History  Diagnosis Date  . Anxiety   . UTI (lower urinary tract infection)    Past Surgical History  Procedure Laterality Date  . Appendectomy     Family History  Problem Relation Age of Onset  . Diabetes Mother   . Hypertension Mother   . Stroke Mother   . Heart failure Father   . Migraines Father    Social History  Substance Use Topics  . Smoking status: Never Smoker   . Smokeless tobacco: None  . Alcohol Use: No   OB History    Gravida Para Term Preterm AB TAB SAB Ectopic Multiple Living   3 3 3       3      Review of Systems  All other systems reviewed and are negative.     Allergies  Review of patient's allergies indicates no known allergies.  Home Medications   Prior to Admission medications   Medication Sig Start Date End Date Taking? Authorizing Provider   acetaminophen (TYLENOL) 500 MG tablet Take 500 mg by mouth every 6 (six) hours as needed for mild pain.    Historical Provider, MD  Ascorbic Acid (VITAMIN C) 1000 MG tablet Take 1,000 mg by mouth 3 (three) times a week.     Historical Provider, MD  B Complex-C (B-COMPLEX WITH VITAMIN C) tablet Take 1 tablet by mouth 3 (three) times a week.     Historical Provider, MD  Collagen 500 MG CAPS Take 1 each by mouth daily. Chews.    Historical Provider, MD  ibuprofen (ADVIL,MOTRIN) 800 MG tablet Take 1 tablet (800 mg total) by mouth 3 (three) times daily. Patient not taking: Reported on 09/22/2015 02/14/14   Francee PiccoloJennifer Piepenbrink, PA-C  methocarbamol (ROBAXIN) 500 MG tablet Take 1 tablet (500 mg total) by mouth 2 (two) times daily. Patient not taking: Reported on 09/22/2015 02/14/14   Francee PiccoloJennifer Piepenbrink, PA-C  Multiple Vitamin (MULTIVITAMIN WITH MINERALS) TABS tablet Take 1 tablet by mouth daily.    Historical Provider, MD  nystatin-triamcinolone (MYCOLOG II) cream Apply to affected area daily Patient not taking: Reported on 09/22/2015 02/26/12   Janne NapoleonHope M Neese, NP  polyvinyl alcohol (LIQUIFILM TEARS) 1.4 % ophthalmic solution Place 1 drop into both eyes daily.    Historical Provider, MD   BP 124/84 mmHg  Pulse 112  Temp(Src) 98.6 F (37 C) (Oral)  Resp 20  Ht  (1.626 m)  Wt 70.761 kg  BMI 26.76 kg/m2  SpO2 100%  LMP 10/03/2014 (Approximate) Physical Exam  Constitutional: She appears well-developed and well-nourished. No distress.  HENT:  Head: Normocephalic and atraumatic.  Right Ear: External ear normal.  Left Ear: External ear normal.  Eyes: Conjunctivae are normal. Right eye exhibits no discharge. Left eye exhibits no discharge. No scleral icterus.  Neck: Neck supple. No tracheal deviation present.  ? Fullness left side of neck  Cardiovascular: Normal rate, regular rhythm and intact distal pulses.   Pulmonary/Chest: Effort normal and breath sounds normal. No stridor. No respiratory  distress. She has no wheezes. She has no rales.  Abdominal: Soft. Bowel sounds are normal. She exhibits no distension. There is no tenderness. There is no rebound and no guarding.  Musculoskeletal: She exhibits no edema or tenderness.  Neurological: She is alert. She has normal strength. No cranial nerve deficit (no facial droop, extraocular movements intact, no slurred speech) or sensory deficit. She exhibits normal muscle tone. She displays no seizure activity. Coordination normal.  Skin: Skin is warm and dry. No rash noted.  Psychiatric: She has a normal mood and affect.  Nursing note and vitals reviewed.   ED Course  Procedures (including critical care time)    EKG Interpretation   Date/Time:  Friday September 23 2015 14:59:35 EDT Ventricular Rate:  104 PR Interval:  144 QRS Duration: 91 QT Interval:  346 QTC Calculation: 455 R Axis:   76 Text Interpretation:  Sinus tachycardia No significant change since last  tracing Confirmed by Shannelle Alguire  MD-J, Desmin Daleo ([id number]) on 09/23/2015 3:10:55 PM      MDM   I reviewed the laboratory tests from yesterday. Patient has a normal hemoglobin renal function. She also has a urine culture that is pending. She had a urinalysis yesterday that showed squamous cells and many bacteria but not clearly consistent with infection.  Because of the patient's persistent complaints of a mass on the left side of her neck I will do a CT scan soft tissue of the neck  to evaluate further.  I also discussed how this would be evaluated further if the scan does identify a nodule.  Dr Fayrene Fearing will follow up on the CT scan.    Linwood Dibbles, MD 09/23/15 [phone]

## 2015-09-23 NOTE — ED Notes (Signed)
#  20 G angiocath removed from right ac.

## 2015-09-23 NOTE — ED Notes (Addendum)
Neck is painful and swollen. States her voice sounds strange. She is extremely anxious. Asking questions about what I think is wrong with her repeatedly.

## 2015-09-24 LAB — URINE CULTURE

## 2015-10-01 ENCOUNTER — Encounter (HOSPITAL_BASED_OUTPATIENT_CLINIC_OR_DEPARTMENT_OTHER): Payer: Self-pay | Admitting: *Deleted

## 2015-10-01 ENCOUNTER — Emergency Department (HOSPITAL_BASED_OUTPATIENT_CLINIC_OR_DEPARTMENT_OTHER)
Admission: EM | Admit: 2015-10-01 | Discharge: 2015-10-01 | Disposition: A | Discharge status: Home / Self Care | Payer: No Typology Code available for payment source | Attending: Emergency Medicine | Admitting: Emergency Medicine

## 2015-10-01 DIAGNOSIS — R070 Pain in throat: Secondary | ICD-10-CM | POA: Insufficient documentation

## 2015-10-01 DIAGNOSIS — N390 Urinary tract infection, site not specified: Secondary | ICD-10-CM | POA: Insufficient documentation

## 2015-10-01 LAB — BASIC METABOLIC PANEL
Anion gap: 7 (ref 5–15)
BUN: 18 mg/dL (ref 6–20)
CO2: 27 mmol/L (ref 22–32)
Calcium: 9.5 mg/dL (ref 8.9–10.3)
Chloride: 106 mmol/L (ref 101–111)
Creatinine, Ser: 0.48 mg/dL (ref 0.44–1.00)
GFR calc Af Amer: >60 mL/min (ref >60)
GFR calc non Af Amer: >60 mL/min (ref >60)
Glucose, Bld: 88 mg/dL (ref 65–99)
Potassium: 4.1 mmol/L (ref 3.5–5.1)
Sodium: 140 mmol/L (ref 135–145)

## 2015-10-01 LAB — CBC WITH DIFFERENTIAL/PLATELET
Basophils Absolute: 0 10*3/uL (ref 0.0–0.1)
Basophils Relative: 0 %
Eosinophils Absolute: 0.1 10*3/uL (ref 0.0–0.7)
Eosinophils Relative: 2 %
HCT: 36.1 % (ref 36.0–46.0)
Hemoglobin: 12.2 g/dL (ref 12.0–15.0)
Lymphocytes Relative: 28 %
Lymphs Abs: 2.7 10*3/uL (ref 0.7–4.0)
MCH: 27.5 pg (ref 26.0–34.0)
MCHC: 33.8 g/dL (ref 30.0–36.0)
MCV: 81.5 fL (ref 78.0–100.0)
Monocytes Absolute: 0.8 10*3/uL (ref 0.1–1.0)
Monocytes Relative: 8 %
Neutro Abs: 5.9 10*3/uL (ref 1.7–7.7)
Neutrophils Relative %: 62 %
Platelets: 361 10*3/uL (ref 150–400)
RBC: 4.43 MIL/uL (ref 3.87–5.11)
RDW: 14 % (ref 11.5–15.5)
WBC: 9.6 10*3/uL (ref 4.0–10.5)

## 2015-10-01 LAB — RAPID STREP SCREEN (MED CTR MEBANE ONLY): Streptococcus, Group A Screen (Direct): NEGATIVE

## 2015-10-01 MED ORDER — KETOROLAC TROMETHAMINE 30 MG/ML IJ SOLN
30.0000 mg | Freq: Once | INTRAMUSCULAR | Status: DC
  Filled 2015-10-01: qty 1

## 2015-10-01 MED ORDER — SODIUM CHLORIDE 0.9 % IV BOLUS (SEPSIS): 1000.0000 mL | Freq: Once | INTRAVENOUS | Status: DC

## 2015-10-01 NOTE — ED Provider Notes (Signed)
CSN: [id number]     Arrival date & time 10/01/15  1721 History   First MD Initiated Contact with Patient 10/01/15 1907     Chief Complaint  Patient presents with  . Sore Throat     (Consider location/radiation/quality/duration/timing/severity/associated sxs/prior Treatment) HPI Crystal Gross is a 50 y.o. female history of anxiety comes in for evaluation of left-sided throat and neck Gross. Patient was seen on 4/21 for similar symptoms of neck Gross and swelling and had CT scan done that showed a thyroid nodule. She is followed by Dr. Maryellen PileWolicky, ENT and she reports having a follow-up appointment scheduled for May 18. She reports increased Gross with swallowing on the left side of her neck over the past 2 days and was told to come to the emergency department for evaluation. She denies any fevers at home, does admit to decreased fluid intake secondary to odynophagia. She does report her voice sounds more nasally, also reports left-sided ear Gross, itchy eyes. Denies any difficulties breathing, cough  Past Medical History  Diagnosis Date  . Anxiety   . UTI (lower urinary tract infection)    Past Surgical History  Procedure Laterality Date  . Appendectomy     Family History  Problem Relation Age of Onset  . Diabetes Mother   . Hypertension Mother   . Stroke Mother   . Heart failure Father   . Migraines Father    Social History  Substance Use Topics  . Smoking status: Never Smoker   . Smokeless tobacco: Never Used  . Alcohol Use: No   OB History    Gravida Para Term Preterm AB TAB SAB Ectopic Multiple Living   3 3 3       3      Review of Systems A 10 point review of systems was completed and was negative except for pertinent positives and negatives as mentioned in the history of present illness     Allergies  Review of patient's allergies indicates no known allergies.  Home Medications   Prior to Admission medications   Medication Sig Start Date End Date Taking?  Authorizing Provider  acetaminophen (TYLENOL) 500 MG tablet Take 500 mg by mouth every 6 (six) hours as needed for mild Gross.    Historical Provider, MD  Ascorbic Acid (VITAMIN C) 1000 MG tablet Take 1,000 mg by mouth 3 (three) times a week.     Historical Provider, MD  B Complex-C (B-COMPLEX WITH VITAMIN C) tablet Take 1 tablet by mouth 3 (three) times a week.     Historical Provider, MD  Collagen 500 MG CAPS Take 1 each by mouth daily. Chews.    Historical Provider, MD  ibuprofen (ADVIL,MOTRIN) 800 MG tablet Take 1 tablet (800 mg total) by mouth 3 (three) times daily. Patient not taking: Reported on 09/22/2015 02/14/14   Francee PiccoloJennifer Piepenbrink, PA-C  methocarbamol (ROBAXIN) 500 MG tablet Take 1 tablet (500 mg total) by mouth 2 (two) times daily. Patient not taking: Reported on 09/22/2015 02/14/14   Francee PiccoloJennifer Piepenbrink, PA-C  Multiple Vitamin (MULTIVITAMIN WITH MINERALS) TABS tablet Take 1 tablet by mouth daily.    Historical Provider, MD  nystatin-triamcinolone (MYCOLOG II) cream Apply to affected area daily Patient not taking: Reported on 09/22/2015 02/26/12   Janne NapoleonHope M Neese, NP  polyvinyl alcohol (LIQUIFILM TEARS) 1.4 % ophthalmic solution Place 1 drop into both eyes daily.    Historical Provider, MD   BP 125/79 mmHg  Pulse 100  Temp(Src) 98.2 F (36.8 C) (Oral)  Resp  18  Ht  (1.626 m)  Wt 70.761 kg  BMI 26.76 kg/m2  SpO2 99%  LMP 10/03/2014 (Approximate) Physical Exam  Constitutional: She is oriented to person, place, and time. She appears well-developed and well-nourished.  HENT:  Head: Normocephalic and atraumatic.  Right Ear: External ear normal.  Left Ear: External ear normal.  Mouth/Throat: Oropharynx is clear and moist.  Oropharynx is clear and moist with no unilateral tonsillar swelling. Small left tonsillolith. No uvular deviation, glossal elevation, trismus or other evidence of deep space infection. Mild cervical adenopathy. Left TM normal. No mastoid tenderness, regular  adenopathy. No other lesions, erythema or other deformities  Eyes: Conjunctivae are normal. Pupils are equal, round, and reactive to light. Right eye exhibits no discharge. Left eye exhibits no discharge. No scleral icterus.  Neck: Neck supple.  Cardiovascular: Normal rate, regular rhythm and normal heart sounds.   Pulmonary/Chest: Effort normal and breath sounds normal. No respiratory distress. She has no wheezes. She has no rales.  Abdominal: Soft. There is no tenderness.  Musculoskeletal: She exhibits no tenderness.  Neurological: She is alert and oriented to person, place, and time.  Cranial Nerves II-XII grossly intact  Skin: Skin is warm and dry. No rash noted.  Psychiatric: She has a normal mood and affect.  Nursing note and vitals reviewed.   ED Course  Procedures (including critical care time) Labs Review Labs Reviewed  RAPID STREP SCREEN (NOT AT [facility])  CULTURE, GROUP A STREP ([facility])  CBC WITH DIFFERENTIAL/PLATELET  BASIC METABOLIC PANEL    Imaging Review No results found. I have personally reviewed and evaluated these images and lab results as part of my medical decision-making.   EKG Interpretation None      MDM  Crystal Gross is a 50 y.o. female because in for evaluation of throat Gross. Patient with recently diagnosed thyroid nodule on CT scan performed on 4/21, followed by ENT. Presents today for increased neck discomfort. Exam is unremarkable and not concerning for any emergent deep space infection. Vital signs are stable and she is afebrile. Obtain screening labs which are also negative. Strep test performed in triage is negative. Patient is eating and drinking comfortably emergency department. Provided patient reassurance and instructed to follow-up with ENT for regularly scheduled appointment. Discussed strict return precautions. She verbalizes understanding and agrees with plan as well as subsequent discharge Prior to patient discharge, I discussed and  reviewed this case with Dr. Madilyn Hook  Final diagnoses:  Throat discomfort        Joycie Peek, PA-C 10/01/15 2204  Tilden Fossa, MD 10/02/15 1500

## 2015-10-01 NOTE — Discharge Instructions (Signed)
There does not appear to be an emergent cause for your symptoms at this time. Please follow-up with Dr. Maryellen PileWolicky for your regularly scheduled appointment. Return to ED for any new or worsening symptoms as we discussed

## 2015-10-01 NOTE — ED Notes (Signed)
Pt given d/c instructions as per chart. Verbalizes understanding. No questions. 

## 2015-10-01 NOTE — ED Notes (Signed)
Has been evaluated for c/o of ear pain and lump in throat [facility] and here prior to today's visit. Seen by Dr Gean QuintWolicke and has appt on May 18. States pain is worse and was advised to come to ED by ENT. Reports shes feels dizzy and has nausea also. Voice is muffled

## 2015-10-04 LAB — CULTURE, GROUP A STREP (THRC)

## 2015-11-23 ENCOUNTER — Other Ambulatory Visit: Payer: Self-pay | Admitting: Otolaryngology

## 2015-11-23 DIAGNOSIS — E041 Nontoxic single thyroid nodule: Secondary | ICD-10-CM

## 2015-12-21 ENCOUNTER — Other Ambulatory Visit: Payer: Self-pay | Admitting: Orthopaedic Surgery

## 2015-12-21 DIAGNOSIS — M545 Low back pain: Secondary | ICD-10-CM

## 2015-12-30 ENCOUNTER — Other Ambulatory Visit: Payer: Self-pay

## 2015-12-31 ENCOUNTER — Ambulatory Visit
Admission: RE | Admit: 2015-12-31 | Discharge: 2015-12-31 | Disposition: A | Discharge status: Home / Self Care | Payer: Worker's Compensation | Source: Ambulatory Visit | Attending: Orthopaedic Surgery | Admitting: Orthopaedic Surgery

## 2015-12-31 DIAGNOSIS — M545 Low back pain: Secondary | ICD-10-CM

## 2016-02-03 ENCOUNTER — Encounter (INDEPENDENT_AMBULATORY_CARE_PROVIDER_SITE_OTHER): Payer: Self-pay

## 2016-02-03 ENCOUNTER — Ambulatory Visit
Admission: RE | Admit: 2016-02-03 | Discharge: 2016-02-03 | Disposition: A | Discharge status: Home / Self Care | Payer: No Typology Code available for payment source | Source: Ambulatory Visit | Attending: Otolaryngology | Admitting: Otolaryngology

## 2016-02-03 DIAGNOSIS — E041 Nontoxic single thyroid nodule: Secondary | ICD-10-CM

## 2016-05-04 ENCOUNTER — Telehealth (INDEPENDENT_AMBULATORY_CARE_PROVIDER_SITE_OTHER): Payer: Self-pay

## 2016-05-04 NOTE — Telephone Encounter (Signed)
Patient left voicemail stating that she is having low back pain that is radiating into her right leg and getting worse. Stated that an appt. Was offered to her for 05/30/16, but stated that it's to long to wait.  Would like a call back at [phone].

## 2016-05-07 NOTE — Telephone Encounter (Signed)
I tried to reach patient. Can only offer first available unless she wants to be seen in [place] office. They would have something sooner. Otherwise, patient can call and check for cancellations.

## 2016-05-08 NOTE — Telephone Encounter (Signed)
Can you try and reach patient and advise of below message? I have been unable to reach her. Thanks.

## 2016-05-08 NOTE — Telephone Encounter (Signed)
Spoke with patient & sch'd appointment for 06/05/16 @ 1:45pm

## 2016-06-05 ENCOUNTER — Ambulatory Visit (INDEPENDENT_AMBULATORY_CARE_PROVIDER_SITE_OTHER): Payer: Medicaid Other | Admitting: Orthopaedic Surgery

## 2016-06-27 ENCOUNTER — Ambulatory Visit (INDEPENDENT_AMBULATORY_CARE_PROVIDER_SITE_OTHER): Payer: Medicaid Other | Admitting: Orthopaedic Surgery

## 2016-07-31 ENCOUNTER — Ambulatory Visit (INDEPENDENT_AMBULATORY_CARE_PROVIDER_SITE_OTHER): Payer: Medicaid Other | Admitting: Orthopaedic Surgery

## 2016-07-31 ENCOUNTER — Encounter (INDEPENDENT_AMBULATORY_CARE_PROVIDER_SITE_OTHER): Payer: Self-pay

## 2016-07-31 ENCOUNTER — Encounter (HOSPITAL_COMMUNITY): Payer: Self-pay | Admitting: Emergency Medicine

## 2016-07-31 ENCOUNTER — Emergency Department (HOSPITAL_COMMUNITY)
Admission: EM | Admit: 2016-07-31 | Discharge: 2016-07-31 | Disposition: A | Discharge status: Home / Self Care | Payer: Self-pay | Attending: Emergency Medicine | Admitting: Emergency Medicine

## 2016-07-31 DIAGNOSIS — J029 Acute pharyngitis, unspecified: Secondary | ICD-10-CM | POA: Insufficient documentation

## 2016-07-31 DIAGNOSIS — R05 Cough: Secondary | ICD-10-CM | POA: Insufficient documentation

## 2016-07-31 DIAGNOSIS — R059 Cough, unspecified: Secondary | ICD-10-CM

## 2016-07-31 LAB — URINALYSIS, ROUTINE W REFLEX MICROSCOPIC
Bacteria, UA: NONE SEEN
Bilirubin Urine: NEGATIVE
Glucose, UA: NEGATIVE mg/dL
Hgb urine dipstick: NEGATIVE
Ketones, ur: NEGATIVE mg/dL
Nitrite: NEGATIVE
Protein, ur: NEGATIVE mg/dL
Specific Gravity, Urine: 1.021 (ref 1.005–1.030)
pH: 6 (ref 5.0–8.0)

## 2016-07-31 LAB — RAPID STREP SCREEN (MED CTR MEBANE ONLY): Streptococcus, Group A Screen (Direct): NEGATIVE

## 2016-07-31 MED ORDER — ACETAMINOPHEN 500 MG PO TABS
1000.0000 mg | ORAL_TABLET | Freq: Once | ORAL | Status: AC
  Administered 2016-07-31: 1000 mg via ORAL
  Filled 2016-07-31: qty 2
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
## 2016-07-31 NOTE — ED Triage Notes (Signed)
Patient c/o productive cough, sore throat and body aches x2 days. Patient also reports urinary frequency and burning with urination x1 week. Denies abdominal pain, N/V/D, SOB and chest pain.

## 2016-08-02 LAB — CULTURE, GROUP A STREP (THRC)

## 2016-08-09 ENCOUNTER — Ambulatory Visit (INDEPENDENT_AMBULATORY_CARE_PROVIDER_SITE_OTHER): Payer: Self-pay | Admitting: Obstetrics and Gynecology

## 2016-08-09 ENCOUNTER — Encounter: Payer: Self-pay | Admitting: Obstetrics and Gynecology

## 2016-08-09 VITALS — BP 123/82 | HR 95 | Wt 169.4 lb

## 2016-08-09 DIAGNOSIS — N950 Postmenopausal bleeding: Secondary | ICD-10-CM | POA: Insufficient documentation

## 2016-08-09 LAB — POCT URINALYSIS DIP (DEVICE)
Bilirubin Urine: NEGATIVE
Glucose, UA: NEGATIVE mg/dL
Hgb urine dipstick: NEGATIVE
Ketones, ur: NEGATIVE mg/dL
Leukocytes, UA: NEGATIVE
Nitrite: NEGATIVE
Protein, ur: NEGATIVE mg/dL
Specific Gravity, Urine: 1.025 (ref 1.005–1.030)
Urobilinogen, UA: 0.2 mg/dL (ref 0.0–1.0)
pH: 6 (ref 5.0–8.0)

## 2016-08-09 NOTE — Progress Notes (Signed)
Obstetrics and Gynecology New Patient Evaluation  Appointment Date: 08/09/2016  OBGYN Clinic: [facility]  Primary Care Provider: Grayce Sessions  Referring Provider: Donita Brooks, MD  Chief Complaint: post menopausal spotting  History of Present Illness: Crystal Gross is a 51 y.o. Caucasian [id number] (LMP: 22 months ago), seen for the above chief complaint.   Patient had LMP approximately 22 months ago; no h/o HRT. Pt states that about two weeks ago she had some mucus with some blood tinge to it x 1. No pain and no other s/s after that one time. +NS/HF and some vaginal dryness preceding her LMP and is getting better but still has s/s. Pt not sexually active.   No current VB, spotting, vaginal itching or discharge and no abdominal pain, dysuria, hematuria, change in BMs.   Review of Systems: as noted in the History of Present Illness.   Past Medical History:  Past Medical History:  Diagnosis Date  . Anxiety   . UTI (lower urinary tract infection)     Past Surgical History:  Past Surgical History:  Procedure Laterality Date  . APPENDECTOMY      Past Obstetrical History:  OB History  Gravida Para Term Preterm AB Living  3 3 3     3   SAB TAB Ectopic Multiple Live Births               # Outcome Date GA Lbr Len/2nd Weight Sex Delivery Anes PTL Lv  3 Term           2 Term           1 Term               SVD x 3.  Past Gynecological History: As per HPI. No HRT Last pap 1-2 years ago and normal and no h/o abnormal paps.   Social History:  Social History   Social History  . Marital status: Single    Spouse name: N/A  . Number of children: N/A  . Years of education: N/A   Occupational History  . Not on file.   Social History Main Topics  . Smoking status: Never Smoker  . Smokeless tobacco: Never Used  . Alcohol use No  . Drug use: No  . Sexual activity: Yes    Birth control/ protection: None, Condom   Other Topics  Concern  . Not on file   Social History Narrative   ** Merged History Encounter **        Family History:  Family History  Problem Relation Age of Onset  . Diabetes Mother   . Hypertension Mother   . Stroke Mother   . Heart failure Father   . Migraines Father     Medications Crystal Gross had no medications administered during this visit. Current Outpatient Prescriptions  Medication Sig Dispense Refill  . Multiple Vitamins-Minerals (MULTIVITAMIN GUMMIES ADULT PO) Take 2 each by mouth daily.    . Ascorbic Acid (VITAMIN C) 1000 MG tablet Take 1,000 mg by mouth 3 (three) times a week.     . Biotin 45409 MCG TABS Take 10,000 mcg by mouth 2 (two) times a week.    . hydroxypropyl methylcellulose / hypromellose (ISOPTO TEARS / GONIOVISC) 2.5 % ophthalmic solution Place 1 drop into both eyes 4 (four) times daily as needed for dry eyes.    Marland Kitchen ibuprofen (ADVIL,MOTRIN) 200 MG tablet Take 200 mg by mouth every 6 (six) hours as needed for fever, headache,  mild pain or moderate pain.    . naproxen sodium (ANAPROX) 220 MG tablet Take 220 mg by mouth 2 (two) times daily as needed (for pain).     No current facility-administered medications for this visit.     Allergies Patient has no known allergies.   Physical Exam:  BP 123/82   Pulse 95   Wt 169 lb 6.4 oz (76.8 kg)   LMP 07/19/2016 (Approximate)   BMI 29.08 kg/m  Body mass index is 29.08 kg/m.  General appearance: Well nourished, well developed female in no acute distress.  Neck:  Supple, normal appearance, and no thyromegaly  Cardiovascular: normal s1 and s2.  No murmurs, rubs or gallops. Respiratory:  Clear to auscultation bilateral. Normal respiratory effort Abdomen: positive bowel sounds and no masses, hernias; diffusely non tender to palpation, non distended. Neuro/Psych:  Normal mood and affect.  Skin:  Warm and dry.  Lymphatic:  No inguinal lymphadenopathy.   Pelvic exam: is not limited by body habitus EGBUS: within  normal limits with mild atrophy, Vagina: mild atrophy; within normal limits and with no blood or discharge in the vault, Cervix: normal appearing cervix without tenderness, discharge or lesions. Uterus:  nonenlarged and non tender and Adnexa:  normal adnexa and no mass, fullness, tenderness Rectovaginal: deferred  Laboratory: neg poc u/a  Radiology: none  Assessment: pt doing well  Plan:  1. Postmenopausal bleeding Pt really anxious re: embx and would like to avoid this if possible. I told her that given just slight spotting, then doing a TVUS and if it has a thin ES that it has same detection rate for malignancy as embx. Pt would like to do this. Will call pt with results. Pt told that even if u/s is negative that if she has repeat bleeding to let us know and would most likely need sampling. Pt amenable to plan RTC PRN.   Cornelia Copaharlie Phoenyx Paulsen, Jr MD Attending [facility](Faculty Practice)

## 2016-08-15 ENCOUNTER — Encounter (INDEPENDENT_AMBULATORY_CARE_PROVIDER_SITE_OTHER): Payer: Self-pay

## 2016-08-15 ENCOUNTER — Ambulatory Visit (INDEPENDENT_AMBULATORY_CARE_PROVIDER_SITE_OTHER): Payer: Medicaid Other | Admitting: Orthopaedic Surgery

## 2016-08-15 ENCOUNTER — Ambulatory Visit (HOSPITAL_COMMUNITY)
Admission: RE | Admit: 2016-08-15 | Discharge: 2016-08-15 | Disposition: A | Discharge status: Home / Self Care | Payer: Self-pay | Source: Ambulatory Visit | Attending: Obstetrics and Gynecology | Admitting: Obstetrics and Gynecology

## 2016-08-15 DIAGNOSIS — N950 Postmenopausal bleeding: Secondary | ICD-10-CM | POA: Insufficient documentation

## 2016-08-17 ENCOUNTER — Telehealth: Payer: Self-pay

## 2016-08-17 NOTE — Telephone Encounter (Signed)
Received call on nurse line patient calling with some question regarding visit. Please call her at [phone]

## 2016-08-17 NOTE — Telephone Encounter (Signed)
Dr Vergie LivingPickens is on vacation, spoke with Dr Alysia PennaErvin, who called and spoke with patient.

## 2016-08-17 NOTE — Telephone Encounter (Signed)
Patient called again, wanted u/s results. Upon review for results, relayed to patient recommendation for endometrial biopsy. Patient became very upset and anxious, wanted to speak with a doctor because she didn't understand what I was saying to her, even after multiple explanations and reassurance. I told her I would message Dr Vergie LivingPickens and see if he could call her to discuss results. Patient stated she would be freaking out all weekend if no one calls her.

## 2016-08-23 ENCOUNTER — Telehealth: Payer: Self-pay | Admitting: Obstetrics and Gynecology

## 2016-08-23 NOTE — Telephone Encounter (Signed)
GYN Telephone Note Patient called at [phone] and d/w her recommendation for embx given u/s results. Pt still asymptomatic. Pt very anxious and nervious but I told her it's most likely fine but do recommend sampling, which she is amenable to. Pt already set up for 4/10 visit. All questions asked and answered  Cornelia Copaharlie Tramain Gershman, Jr MD Attending [facility] ([facility]ulty Practice) 08/23/2016 Time: 1243pm

## 2016-09-11 ENCOUNTER — Ambulatory Visit (INDEPENDENT_AMBULATORY_CARE_PROVIDER_SITE_OTHER): Payer: Self-pay | Admitting: Obstetrics and Gynecology

## 2016-09-11 ENCOUNTER — Encounter: Payer: Self-pay | Admitting: Obstetrics and Gynecology

## 2016-09-11 ENCOUNTER — Other Ambulatory Visit (HOSPITAL_COMMUNITY)
Admission: RE | Admit: 2016-09-11 | Discharge: 2016-09-11 | Disposition: A | Discharge status: Home / Self Care | Payer: No Typology Code available for payment source | Source: Ambulatory Visit | Attending: Obstetrics and Gynecology | Admitting: Obstetrics and Gynecology

## 2016-09-11 VITALS — BP 137/88 | HR 116 | Ht 64.0 in | Wt 168.0 lb

## 2016-09-11 DIAGNOSIS — Z124 Encounter for screening for malignant neoplasm of cervix: Secondary | ICD-10-CM

## 2016-09-11 DIAGNOSIS — Z1151 Encounter for screening for human papillomavirus (HPV): Secondary | ICD-10-CM

## 2016-09-11 DIAGNOSIS — Z3202 Encounter for pregnancy test, result negative: Secondary | ICD-10-CM

## 2016-09-11 DIAGNOSIS — N950 Postmenopausal bleeding: Secondary | ICD-10-CM | POA: Insufficient documentation

## 2016-09-11 LAB — POCT PREGNANCY, URINE: Preg Test, Ur: NEGATIVE

## 2016-09-11 NOTE — Procedures (Signed)
Pap Smear and Endometrial Biopsy Procedure Note  Pre-operative Diagnosis: Postmenopausal bleeding. 5mm endometrial stripe. No bleeding or spotting since her last visit  Post-operative Diagnosis: same  Procedure Details  Given degree of anxiety (patient brought friend with her to help convince her to do the procedure) and 10 minutes required to convince her to do the procedure, I did a pap smear. Last pap in the system was 5 years ago and was NILM and HPV negative; pt states she had one done 1-2 years ago, but I wonder if she is mistaken given the high degree of anxiety she has with pelvic exams. Urine pregnancy test was done  and result was negative.  The risks (including infection, bleeding, pain, and uterine perforation) and benefits of the procedure were explained to the patient and Written informed consent was obtained.  The patient was placed in the dorsal lithotomy position.  Bimanual exam showed the uterus to be in the anteroflexed position.  A Graves' speculum inserted in the vagina, and the cervix was visualized and a pap smear performed. The cervix was then prepped with povidone iodine, and a sharp tenaculum was applied to the anterior lip of the cervix for stabilization.  A pipelle was inserted into the uterine cavity and sounded the uterus to a depth of 8cm.  A Small amount of tissue was collected after 2 passes. The sample was sent for pathologic examination.  Condition: Stable  Complications: None  Plan: The patient was advised to call for any fever or for prolonged or severe pain or bleeding. She was advised to use OTC analgesics as needed for mild to moderate pain. She was advised to avoid vaginal intercourse for 48 hours or until the bleeding has completely stopped. Patient confirms [phone] as best #.   Shahmeer Bunn, Jr MD Attending [facility] (Faculty Practice)   

## 2016-09-11 NOTE — Progress Notes (Deleted)
Pap Smear and Endometrial Biopsy Procedure Note  Pre-operative Diagnosis: Postmenopausal bleeding. 5mm endometrial stripe. No bleeding or spotting since her last visit  Post-operative Diagnosis: same  Procedure Details  Given degree of anxiety (patient brought friend with her to help convince her to do the procedure) and 10 minutes required to convince her to do the procedure, I did a pap smear. Last pap in the system was 5 years ago and was NILM and HPV negative; pt states she had one done 1-2 years ago, but I wonder if she is mistaken given the high degree of anxiety she has with pelvic exams. Urine pregnancy test was done  and result was negative.  The risks (including infection, bleeding, pain, and uterine perforation) and benefits of the procedure were explained to the patient and Written informed consent was obtained.  The patient was placed in the dorsal lithotomy position.  Bimanual exam showed the uterus to be in the anteroflexed position.  A Graves' speculum inserted in the vagina, and the cervix was visualized and a pap smear performed. The cervix was then prepped with povidone iodine, and a sharp tenaculum was applied to the anterior lip of the cervix for stabilization.  A pipelle was inserted into the uterine cavity and sounded the uterus to a depth of 8cm.  A Small amount of tissue was collected after 2 passes. The sample was sent for pathologic examination.  Condition: Stable  Complications: None  Plan: The patient was advised to call for any fever or for prolonged or severe pain or bleeding. She was advised to use OTC analgesics as needed for mild to moderate pain. She was advised to avoid vaginal intercourse for 48 hours or until the bleeding has completely stopped. Patient confirms [phone] as best #.   Cornelia Copa MD Attending [facility])

## 2016-09-13 ENCOUNTER — Telehealth: Payer: Self-pay | Admitting: Obstetrics and Gynecology

## 2016-09-13 LAB — CYTOLOGY - PAP
Diagnosis: NEGATIVE
HPV: NOT DETECTED

## 2016-09-13 NOTE — Telephone Encounter (Signed)
GYN Note Patient called and told about negative results. Pt told to call us with any questions or concerns in the future and pt told about need for mammo and colonoscopy routine screening which she is amenable to. Options and resources given to patient.  Cornelia Copa MD Attending [facility] (Faculty Practice) 09/13/2016 Time: [phone]

## 2016-09-21 ENCOUNTER — Other Ambulatory Visit: Payer: Self-pay | Admitting: Obstetrics and Gynecology

## 2016-09-21 DIAGNOSIS — Z1231 Encounter for screening mammogram for malignant neoplasm of breast: Secondary | ICD-10-CM

## 2016-10-11 ENCOUNTER — Ambulatory Visit
Admission: RE | Admit: 2016-10-11 | Discharge: 2016-10-11 | Disposition: A | Discharge status: Home / Self Care | Payer: No Typology Code available for payment source | Source: Ambulatory Visit | Attending: Obstetrics and Gynecology | Admitting: Obstetrics and Gynecology

## 2016-10-11 DIAGNOSIS — Z1231 Encounter for screening mammogram for malignant neoplasm of breast: Secondary | ICD-10-CM

## 2016-10-16 ENCOUNTER — Ambulatory Visit (INDEPENDENT_AMBULATORY_CARE_PROVIDER_SITE_OTHER): Payer: Medicaid Other | Admitting: Orthopaedic Surgery

## 2016-10-23 ENCOUNTER — Emergency Department (HOSPITAL_COMMUNITY): Payer: No Typology Code available for payment source

## 2016-10-23 ENCOUNTER — Encounter (HOSPITAL_COMMUNITY): Payer: Self-pay

## 2016-10-23 ENCOUNTER — Emergency Department (HOSPITAL_COMMUNITY)
Admission: EM | Admit: 2016-10-23 | Discharge: 2016-10-23 | Disposition: A | Discharge status: Home / Self Care | Payer: No Typology Code available for payment source | Attending: Emergency Medicine | Admitting: Emergency Medicine

## 2016-10-23 DIAGNOSIS — M25562 Pain in left knee: Secondary | ICD-10-CM

## 2016-10-23 DIAGNOSIS — Y93E5 Activity, floor mopping and cleaning: Secondary | ICD-10-CM | POA: Insufficient documentation

## 2016-10-23 DIAGNOSIS — Z79899 Other long term (current) drug therapy: Secondary | ICD-10-CM | POA: Insufficient documentation

## 2016-10-23 DIAGNOSIS — W010XXA Fall on same level from slipping, tripping and stumbling without subsequent striking against object, initial encounter: Secondary | ICD-10-CM | POA: Insufficient documentation

## 2016-10-23 DIAGNOSIS — Y999 Unspecified external cause status: Secondary | ICD-10-CM | POA: Insufficient documentation

## 2016-10-23 DIAGNOSIS — Y92000 Kitchen of unspecified non-institutional (private) residence as  the place of occurrence of the external cause: Secondary | ICD-10-CM | POA: Insufficient documentation

## 2016-10-23 MED ORDER — NAPROXEN 250 MG PO TABS
500.0000 mg | ORAL_TABLET | Freq: Once | ORAL | Status: DC
Start: 2016-10-23 — End: 2016-10-24
  Filled 2016-10-23: qty 2

## 2016-10-23 MED ORDER — NAPROXEN 500 MG PO TABS: 500.0000 mg | ORAL_TABLET | Freq: Two times a day (BID) | ORAL | 0 refills | Status: DC
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
## 2016-10-23 NOTE — ED Notes (Signed)
Pt ambulated with steady gait with walker.

## 2016-10-23 NOTE — Progress Notes (Signed)
Orthopedic Tech Progress Note Patient Details:  Crystal ChannelGiovanna A Gross 1965/11/19 [id number]  Ortho Devices Type of Ortho Device: Knee Immobilizer Ortho Device/Splint Location: LLE Ortho Device/Splint Interventions: Ordered, Application   Crystal Gross, Crystal Gross 10/23/2016, 10:28 PM

## 2016-10-23 NOTE — ED Triage Notes (Signed)
Pt reports she slipped this morning around 8am in the kitchen and reports left knee pain, states she heard a pop

## 2016-10-23 NOTE — ED Notes (Signed)
Pt stable, understands discharge instructions, and reasons for return.   

## 2016-11-07 ENCOUNTER — Ambulatory Visit: Payer: Self-pay | Admitting: Obstetrics and Gynecology

## 2016-12-11 ENCOUNTER — Encounter (INDEPENDENT_AMBULATORY_CARE_PROVIDER_SITE_OTHER): Payer: Self-pay | Admitting: Orthopaedic Surgery

## 2016-12-11 ENCOUNTER — Ambulatory Visit (INDEPENDENT_AMBULATORY_CARE_PROVIDER_SITE_OTHER): Payer: Worker's Compensation | Admitting: Orthopaedic Surgery

## 2016-12-11 VITALS — BP 125/81 | HR 93 | Ht 64.0 in | Wt 160.0 lb

## 2016-12-11 DIAGNOSIS — S86912D Strain of unspecified muscle(s) and tendon(s) at lower leg level, left leg, subsequent encounter: Secondary | ICD-10-CM
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
## 2017-03-15 ENCOUNTER — Other Ambulatory Visit: Payer: Self-pay | Admitting: Otolaryngology

## 2017-03-15 DIAGNOSIS — E041 Nontoxic single thyroid nodule: Secondary | ICD-10-CM

## 2017-03-18 ENCOUNTER — Ambulatory Visit
Admission: RE | Admit: 2017-03-18 | Discharge: 2017-03-18 | Disposition: A | Discharge status: Home / Self Care | Payer: No Typology Code available for payment source | Source: Ambulatory Visit | Attending: Otolaryngology | Admitting: Otolaryngology

## 2017-03-18 DIAGNOSIS — E041 Nontoxic single thyroid nodule: Secondary | ICD-10-CM

## 2017-04-16 ENCOUNTER — Ambulatory Visit (INDEPENDENT_AMBULATORY_CARE_PROVIDER_SITE_OTHER): Payer: Worker's Compensation | Admitting: Orthopaedic Surgery

## 2017-04-16 ENCOUNTER — Encounter (INDEPENDENT_AMBULATORY_CARE_PROVIDER_SITE_OTHER): Payer: Self-pay | Admitting: Orthopaedic Surgery

## 2017-04-16 VITALS — BP 130/89 | HR 103

## 2017-04-16 DIAGNOSIS — M545 Low back pain, unspecified: Secondary | ICD-10-CM
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
## 2017-07-04 ENCOUNTER — Telehealth (INDEPENDENT_AMBULATORY_CARE_PROVIDER_SITE_OTHER): Payer: Self-pay | Admitting: Orthopaedic Surgery

## 2017-07-04 NOTE — Telephone Encounter (Signed)
Patient has called again stating that she spoke to her attorney ([employer]) and her attorney would like a copy of the correction faxed over to them at [phone].  Thank you.

## 2017-07-04 NOTE — Telephone Encounter (Signed)
Patient called saying she was looking over her medical records and read somewhere that she refused a medial note or something of the sort? She said this is false and wants this removed from her records or she will take our office to court. I tried to get as much information as I could but she doesn't know from what visit or where exactly she saw it. She is very upset as she says she has never refused or denied anything that Dr. Ophelia CharterYates told her to do. She states this is an urgent matter and needs a call back to advise her or let her know when you find it and delete it. # [phone]

## 2017-07-05 ENCOUNTER — Telehealth (INDEPENDENT_AMBULATORY_CARE_PROVIDER_SITE_OTHER): Payer: Self-pay

## 2017-07-05 ENCOUNTER — Encounter (INDEPENDENT_AMBULATORY_CARE_PROVIDER_SITE_OTHER): Payer: Self-pay | Admitting: Orthopaedic Surgery

## 2017-07-05 NOTE — Telephone Encounter (Signed)
Patient calling again concerning an incorrect office note.

## 2017-07-05 NOTE — Telephone Encounter (Signed)
I called and talked to pt for more than 30 minutes, dictation  Note from 2017 reviewed with her . She is unhappy with the dictation that states she refused the note for regular work no restrictions.  I dictated a note on Epic that can be faxed to her atty. Concerning her feelings that this was a miscommunication problem. OK to fax note to her atty.

## 2017-07-05 NOTE — Telephone Encounter (Signed)
Please advise. Patient note this is regarding [facility].

## 2017-07-05 NOTE — Telephone Encounter (Signed)
Duplicate message. 

## 2017-07-08 NOTE — Telephone Encounter (Signed)
Ok to fax to attorney?

## 2017-07-08 NOTE — Telephone Encounter (Signed)
I called patient and advised note at front for pick up. Patient states Dr. Ophelia CharterYates told her we would fax to her attorney at number provided. She would also like copy mailed to her. She confirmed address in chart.

## 2017-07-09 NOTE — Telephone Encounter (Signed)
It is okay, and I have faxed to [employer] to [phone] and pt requested

## 2017-07-24 ENCOUNTER — Other Ambulatory Visit: Payer: Self-pay | Admitting: Family Medicine

## 2017-07-24 DIAGNOSIS — S83242A Other tear of medial meniscus, current injury, left knee, initial encounter: Secondary | ICD-10-CM

## 2017-07-24 DIAGNOSIS — M67912 Unspecified disorder of synovium and tendon, left shoulder: Secondary | ICD-10-CM

## 2017-08-12 ENCOUNTER — Other Ambulatory Visit: Payer: Self-pay

## 2017-08-12 ENCOUNTER — Encounter (HOSPITAL_BASED_OUTPATIENT_CLINIC_OR_DEPARTMENT_OTHER): Payer: Self-pay | Admitting: *Deleted

## 2017-08-12 ENCOUNTER — Emergency Department (HOSPITAL_BASED_OUTPATIENT_CLINIC_OR_DEPARTMENT_OTHER)
Admission: EM | Admit: 2017-08-12 | Discharge: 2017-08-12 | Disposition: A | Discharge status: Home / Self Care | Payer: Self-pay | Attending: Emergency Medicine | Admitting: Emergency Medicine

## 2017-08-12 DIAGNOSIS — K0889 Other specified disorders of teeth and supporting structures: Secondary | ICD-10-CM | POA: Insufficient documentation

## 2017-08-12 DIAGNOSIS — Z5321 Procedure and treatment not carried out due to patient leaving prior to being seen by health care provider: Secondary | ICD-10-CM | POA: Insufficient documentation

## 2017-08-12 NOTE — ED Triage Notes (Signed)
Mouth pain. States the pain started after her braces her adjusted x 2 months. States her orthodontist will not return her calls and refuses to see her any further.

## 2017-08-13 ENCOUNTER — Ambulatory Visit (INDEPENDENT_AMBULATORY_CARE_PROVIDER_SITE_OTHER): Payer: Self-pay | Admitting: Orthopaedic Surgery

## 2017-08-16 ENCOUNTER — Ambulatory Visit (INDEPENDENT_AMBULATORY_CARE_PROVIDER_SITE_OTHER): Payer: Self-pay | Admitting: Orthopaedic Surgery

## 2017-09-27 ENCOUNTER — Other Ambulatory Visit: Payer: Self-pay

## 2017-10-14 ENCOUNTER — Ambulatory Visit
Admission: RE | Admit: 2017-10-14 | Discharge: 2017-10-14 | Disposition: A | Discharge status: Home / Self Care | Payer: Self-pay | Source: Ambulatory Visit | Attending: Family Medicine | Admitting: Family Medicine

## 2017-10-14 DIAGNOSIS — S83242A Other tear of medial meniscus, current injury, left knee, initial encounter: Secondary | ICD-10-CM

## 2017-10-14 DIAGNOSIS — M67912 Unspecified disorder of synovium and tendon, left shoulder: Secondary | ICD-10-CM

## 2018-01-20 ENCOUNTER — Other Ambulatory Visit: Payer: Self-pay | Admitting: Physician Assistant

## 2018-01-20 DIAGNOSIS — Z1231 Encounter for screening mammogram for malignant neoplasm of breast: Secondary | ICD-10-CM

## 2018-02-04 ENCOUNTER — Emergency Department (HOSPITAL_COMMUNITY)
Admission: EM | Admit: 2018-02-04 | Discharge: 2018-02-04 | Disposition: A | Discharge status: Home / Self Care | Payer: No Typology Code available for payment source | Attending: Emergency Medicine | Admitting: Emergency Medicine

## 2018-02-04 ENCOUNTER — Encounter (HOSPITAL_COMMUNITY): Payer: Self-pay | Admitting: Emergency Medicine

## 2018-02-04 DIAGNOSIS — Y999 Unspecified external cause status: Secondary | ICD-10-CM | POA: Diagnosis not present

## 2018-02-04 DIAGNOSIS — R51 Headache: Secondary | ICD-10-CM | POA: Diagnosis present

## 2018-02-04 DIAGNOSIS — Y939 Activity, unspecified: Secondary | ICD-10-CM | POA: Diagnosis not present

## 2018-02-04 DIAGNOSIS — R42 Dizziness and giddiness: Secondary | ICD-10-CM | POA: Diagnosis not present

## 2018-02-04 DIAGNOSIS — R110 Nausea: Secondary | ICD-10-CM | POA: Diagnosis not present

## 2018-02-04 DIAGNOSIS — Z5321 Procedure and treatment not carried out due to patient leaving prior to being seen by health care provider: Secondary | ICD-10-CM | POA: Diagnosis not present

## 2018-02-04 DIAGNOSIS — M542 Cervicalgia: Secondary | ICD-10-CM | POA: Diagnosis not present

## 2018-02-04 DIAGNOSIS — Y92410 Unspecified street and highway as the place of occurrence of the external cause: Secondary | ICD-10-CM | POA: Insufficient documentation

## 2018-02-04 NOTE — ED Notes (Signed)
Pt LWBS, told tech they were going to go to another hospital.
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
## 2018-02-05 ENCOUNTER — Other Ambulatory Visit: Payer: Self-pay

## 2018-02-05 ENCOUNTER — Encounter (HOSPITAL_BASED_OUTPATIENT_CLINIC_OR_DEPARTMENT_OTHER): Payer: Self-pay | Admitting: *Deleted

## 2018-02-05 ENCOUNTER — Emergency Department (HOSPITAL_BASED_OUTPATIENT_CLINIC_OR_DEPARTMENT_OTHER): Payer: No Typology Code available for payment source

## 2018-02-05 ENCOUNTER — Emergency Department (HOSPITAL_BASED_OUTPATIENT_CLINIC_OR_DEPARTMENT_OTHER)
Admission: EM | Admit: 2018-02-05 | Discharge: 2018-02-05 | Disposition: A | Discharge status: Home / Self Care | Payer: No Typology Code available for payment source | Attending: Emergency Medicine | Admitting: Emergency Medicine

## 2018-02-05 DIAGNOSIS — Y9389 Activity, other specified: Secondary | ICD-10-CM | POA: Insufficient documentation

## 2018-02-05 DIAGNOSIS — R42 Dizziness and giddiness: Secondary | ICD-10-CM | POA: Insufficient documentation

## 2018-02-05 DIAGNOSIS — Y92410 Unspecified street and highway as the place of occurrence of the external cause: Secondary | ICD-10-CM | POA: Diagnosis not present

## 2018-02-05 DIAGNOSIS — Z79899 Other long term (current) drug therapy: Secondary | ICD-10-CM | POA: Insufficient documentation

## 2018-02-05 DIAGNOSIS — R110 Nausea: Secondary | ICD-10-CM | POA: Insufficient documentation

## 2018-02-05 DIAGNOSIS — Y999 Unspecified external cause status: Secondary | ICD-10-CM | POA: Insufficient documentation

## 2018-02-05 DIAGNOSIS — S199XXA Unspecified injury of neck, initial encounter: Secondary | ICD-10-CM | POA: Diagnosis present

## 2018-02-05 MED ORDER — ACETAMINOPHEN 500 MG PO TABS
ORAL_TABLET | ORAL | Status: AC
  Filled 2018-02-05: qty 2

## 2018-02-05 MED ORDER — NAPROXEN 250 MG PO TABS
ORAL_TABLET | ORAL | Status: AC
  Filled 2018-02-05: qty 2

## 2018-02-05 MED ORDER — NAPROXEN 375 MG PO TABS: 375.0000 mg | ORAL_TABLET | Freq: Two times a day (BID) | ORAL | 0 refills | Status: DC

## 2018-02-05 MED ORDER — METHOCARBAMOL 500 MG PO TABS: 500.0000 mg | ORAL_TABLET | Freq: Two times a day (BID) | ORAL | 0 refills | Status: DC

## 2018-02-05 MED ORDER — ACETAMINOPHEN 500 MG PO TABS
1000.0000 mg | ORAL_TABLET | Freq: Once | ORAL | Status: AC
  Administered 2018-02-05: 1000 mg via ORAL

## 2018-02-05 MED ORDER — NAPROXEN 250 MG PO TABS: 500.0000 mg | ORAL_TABLET | Freq: Once | ORAL | Status: DC
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
## 2018-02-17 ENCOUNTER — Ambulatory Visit
Admission: RE | Admit: 2018-02-17 | Discharge: 2018-02-17 | Disposition: A | Discharge status: Home / Self Care | Payer: Medicaid Other | Source: Ambulatory Visit | Attending: Physician Assistant | Admitting: Physician Assistant

## 2018-02-17 DIAGNOSIS — Z1231 Encounter for screening mammogram for malignant neoplasm of breast: Secondary | ICD-10-CM

## 2018-03-03 ENCOUNTER — Other Ambulatory Visit: Payer: Self-pay | Admitting: Family Medicine

## 2018-03-03 DIAGNOSIS — S83242A Other tear of medial meniscus, current injury, left knee, initial encounter: Secondary | ICD-10-CM

## 2018-03-03 DIAGNOSIS — M67912 Unspecified disorder of synovium and tendon, left shoulder: Secondary | ICD-10-CM

## 2019-02-17 ENCOUNTER — Other Ambulatory Visit: Payer: Self-pay | Admitting: Physician Assistant

## 2019-02-17 DIAGNOSIS — Z1231 Encounter for screening mammogram for malignant neoplasm of breast: Secondary | ICD-10-CM

## 2019-03-02 ENCOUNTER — Other Ambulatory Visit: Payer: Self-pay | Admitting: Otolaryngology

## 2019-03-02 DIAGNOSIS — E049 Nontoxic goiter, unspecified: Secondary | ICD-10-CM

## 2019-03-05 ENCOUNTER — Ambulatory Visit
Admission: RE | Admit: 2019-03-05 | Discharge: 2019-03-05 | Disposition: A | Discharge status: Home / Self Care | Payer: Medicaid Other | Source: Ambulatory Visit | Attending: Otolaryngology | Admitting: Otolaryngology

## 2019-03-05 DIAGNOSIS — E049 Nontoxic goiter, unspecified: Secondary | ICD-10-CM

## 2019-04-02 ENCOUNTER — Ambulatory Visit
Admission: RE | Admit: 2019-04-02 | Discharge: 2019-04-02 | Disposition: A | Discharge status: Home / Self Care | Payer: Medicaid Other | Source: Ambulatory Visit | Attending: Physician Assistant | Admitting: Physician Assistant

## 2019-04-02 ENCOUNTER — Other Ambulatory Visit: Payer: Self-pay

## 2019-04-02 DIAGNOSIS — Z1231 Encounter for screening mammogram for malignant neoplasm of breast: Secondary | ICD-10-CM

## 2019-05-12 ENCOUNTER — Other Ambulatory Visit: Payer: Self-pay

## 2019-05-12 ENCOUNTER — Ambulatory Visit: Payer: Medicaid Other | Attending: Family Medicine | Admitting: Physical Therapy

## 2019-05-12 ENCOUNTER — Encounter: Payer: Self-pay | Admitting: Physical Therapy

## 2019-05-12 DIAGNOSIS — M6281 Muscle weakness (generalized): Secondary | ICD-10-CM | POA: Diagnosis present

## 2019-05-12 DIAGNOSIS — M25561 Pain in right knee: Secondary | ICD-10-CM | POA: Diagnosis present

## 2019-05-12 DIAGNOSIS — M25532 Pain in left wrist: Secondary | ICD-10-CM | POA: Insufficient documentation

## 2019-05-12 NOTE — Patient Instructions (Addendum)
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
## 2019-05-19 ENCOUNTER — Ambulatory Visit: Payer: Medicaid Other | Admitting: Physical Therapy

## 2019-05-26 ENCOUNTER — Other Ambulatory Visit: Payer: Self-pay

## 2019-05-26 ENCOUNTER — Ambulatory Visit: Payer: Medicaid Other | Admitting: Physical Therapy

## 2019-05-26 DIAGNOSIS — M25532 Pain in left wrist: Secondary | ICD-10-CM

## 2019-05-26 DIAGNOSIS — M25561 Pain in right knee: Secondary | ICD-10-CM

## 2019-05-26 DIAGNOSIS — M6281 Muscle weakness (generalized): Secondary | ICD-10-CM
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
## 2019-06-02 ENCOUNTER — Other Ambulatory Visit: Payer: Self-pay

## 2019-06-02 ENCOUNTER — Ambulatory Visit: Payer: Medicaid Other | Admitting: Physical Therapy

## 2019-06-02 DIAGNOSIS — M25532 Pain in left wrist: Secondary | ICD-10-CM

## 2019-06-02 DIAGNOSIS — M25561 Pain in right knee: Secondary | ICD-10-CM

## 2019-06-02 DIAGNOSIS — M6281 Muscle weakness (generalized): Secondary | ICD-10-CM
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
## 2019-06-03 ENCOUNTER — Encounter: Payer: Self-pay | Admitting: Physical Therapy

## 2019-06-03 ENCOUNTER — Ambulatory Visit: Payer: Medicaid Other | Admitting: Physical Therapy

## 2019-06-03 DIAGNOSIS — M6281 Muscle weakness (generalized): Secondary | ICD-10-CM

## 2019-06-03 DIAGNOSIS — M25532 Pain in left wrist: Secondary | ICD-10-CM | POA: Diagnosis not present

## 2019-06-03 DIAGNOSIS — M25561 Pain in right knee: Secondary | ICD-10-CM
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
## 2019-06-05 HISTORY — PX: COLONOSCOPY: SHX174

## 2019-06-15 ENCOUNTER — Ambulatory Visit: Payer: Medicaid Other | Attending: Family Medicine | Admitting: Physical Therapy

## 2019-06-15 ENCOUNTER — Encounter: Payer: Self-pay | Admitting: Physical Therapy

## 2019-06-15 ENCOUNTER — Other Ambulatory Visit: Payer: Self-pay

## 2019-06-15 DIAGNOSIS — M25532 Pain in left wrist: Secondary | ICD-10-CM | POA: Diagnosis not present

## 2019-06-15 DIAGNOSIS — M6281 Muscle weakness (generalized): Secondary | ICD-10-CM | POA: Insufficient documentation

## 2019-06-15 DIAGNOSIS — M25561 Pain in right knee: Secondary | ICD-10-CM
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
## 2019-06-23 ENCOUNTER — Other Ambulatory Visit: Payer: Self-pay

## 2019-06-23 ENCOUNTER — Ambulatory Visit: Payer: Medicaid Other | Admitting: Physical Therapy

## 2019-06-23 DIAGNOSIS — M25532 Pain in left wrist: Secondary | ICD-10-CM | POA: Diagnosis not present

## 2019-06-23 DIAGNOSIS — M6281 Muscle weakness (generalized): Secondary | ICD-10-CM

## 2019-06-23 DIAGNOSIS — M25561 Pain in right knee: Secondary | ICD-10-CM
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
## 2019-07-02 ENCOUNTER — Other Ambulatory Visit: Payer: Self-pay

## 2019-07-02 ENCOUNTER — Ambulatory Visit: Payer: Medicaid Other | Admitting: Physical Therapy

## 2019-07-02 DIAGNOSIS — M25532 Pain in left wrist: Secondary | ICD-10-CM

## 2019-07-02 DIAGNOSIS — M25561 Pain in right knee: Secondary | ICD-10-CM

## 2019-07-02 DIAGNOSIS — M6281 Muscle weakness (generalized): Secondary | ICD-10-CM
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
## 2019-07-16 ENCOUNTER — Other Ambulatory Visit: Payer: Self-pay

## 2019-07-16 ENCOUNTER — Ambulatory Visit: Payer: Medicaid Other | Attending: Family Medicine | Admitting: Physical Therapy

## 2019-07-16 DIAGNOSIS — M25532 Pain in left wrist: Secondary | ICD-10-CM | POA: Diagnosis present

## 2019-07-16 DIAGNOSIS — M6281 Muscle weakness (generalized): Secondary | ICD-10-CM | POA: Diagnosis present

## 2019-07-16 DIAGNOSIS — M25561 Pain in right knee: Secondary | ICD-10-CM | POA: Diagnosis present
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
## 2019-07-23 ENCOUNTER — Ambulatory Visit: Payer: Medicaid Other | Admitting: Physical Therapy

## 2019-07-27 ENCOUNTER — Encounter: Payer: Self-pay | Admitting: Physical Therapy

## 2019-07-27 ENCOUNTER — Other Ambulatory Visit: Payer: Self-pay

## 2019-07-27 ENCOUNTER — Ambulatory Visit: Payer: Medicaid Other | Admitting: Physical Therapy

## 2019-07-27 DIAGNOSIS — M25561 Pain in right knee: Secondary | ICD-10-CM

## 2019-07-27 DIAGNOSIS — M6281 Muscle weakness (generalized): Secondary | ICD-10-CM

## 2019-07-27 DIAGNOSIS — M25532 Pain in left wrist: Secondary | ICD-10-CM
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
## 2019-08-04 ENCOUNTER — Other Ambulatory Visit: Payer: Self-pay

## 2019-08-04 ENCOUNTER — Ambulatory Visit: Payer: Medicaid Other | Attending: Family Medicine

## 2019-08-04 DIAGNOSIS — M25532 Pain in left wrist: Secondary | ICD-10-CM | POA: Diagnosis present

## 2019-08-04 DIAGNOSIS — M25561 Pain in right knee: Secondary | ICD-10-CM | POA: Diagnosis not present

## 2019-08-04 DIAGNOSIS — M6281 Muscle weakness (generalized): Secondary | ICD-10-CM
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
## 2019-08-11 ENCOUNTER — Other Ambulatory Visit: Payer: Self-pay

## 2019-08-11 ENCOUNTER — Encounter: Payer: Self-pay | Admitting: Physical Therapy

## 2019-08-11 ENCOUNTER — Ambulatory Visit: Payer: Medicaid Other | Admitting: Physical Therapy

## 2019-08-11 DIAGNOSIS — M25561 Pain in right knee: Secondary | ICD-10-CM

## 2019-08-11 DIAGNOSIS — M6281 Muscle weakness (generalized): Secondary | ICD-10-CM

## 2019-08-11 DIAGNOSIS — M25532 Pain in left wrist: Secondary | ICD-10-CM
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
## 2019-08-18 ENCOUNTER — Encounter: Payer: Self-pay | Admitting: Physical Therapy

## 2019-08-18 ENCOUNTER — Ambulatory Visit: Payer: Medicaid Other | Admitting: Physical Therapy

## 2019-08-18 ENCOUNTER — Other Ambulatory Visit: Payer: Self-pay

## 2019-08-18 DIAGNOSIS — M25532 Pain in left wrist: Secondary | ICD-10-CM

## 2019-08-18 DIAGNOSIS — M6281 Muscle weakness (generalized): Secondary | ICD-10-CM

## 2019-08-18 DIAGNOSIS — M25561 Pain in right knee: Secondary | ICD-10-CM | POA: Diagnosis not present
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
## 2019-08-25 ENCOUNTER — Ambulatory Visit: Payer: Medicaid Other | Admitting: Physical Therapy

## 2019-08-25 ENCOUNTER — Other Ambulatory Visit: Payer: Self-pay

## 2019-08-25 ENCOUNTER — Encounter: Payer: Self-pay | Admitting: Physical Therapy

## 2019-08-25 DIAGNOSIS — M25561 Pain in right knee: Secondary | ICD-10-CM | POA: Diagnosis not present

## 2019-08-25 DIAGNOSIS — M25532 Pain in left wrist: Secondary | ICD-10-CM

## 2019-08-25 DIAGNOSIS — M6281 Muscle weakness (generalized): Secondary | ICD-10-CM
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
## 2019-09-01 ENCOUNTER — Ambulatory Visit: Payer: Medicaid Other | Admitting: Physical Therapy

## 2019-09-01 ENCOUNTER — Encounter: Payer: Self-pay | Admitting: Physical Therapy

## 2019-09-01 ENCOUNTER — Other Ambulatory Visit: Payer: Self-pay

## 2019-09-01 DIAGNOSIS — M25561 Pain in right knee: Secondary | ICD-10-CM | POA: Diagnosis not present

## 2019-09-01 DIAGNOSIS — M6281 Muscle weakness (generalized): Secondary | ICD-10-CM

## 2019-09-01 DIAGNOSIS — M25532 Pain in left wrist: Secondary | ICD-10-CM
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
## 2019-09-03 ENCOUNTER — Encounter: Payer: Self-pay | Admitting: Physical Therapy

## 2019-09-03 ENCOUNTER — Other Ambulatory Visit: Payer: Self-pay

## 2019-09-03 ENCOUNTER — Ambulatory Visit: Payer: Medicaid Other | Attending: Family Medicine | Admitting: Physical Therapy

## 2019-09-03 DIAGNOSIS — M6281 Muscle weakness (generalized): Secondary | ICD-10-CM | POA: Insufficient documentation

## 2019-09-03 DIAGNOSIS — M25532 Pain in left wrist: Secondary | ICD-10-CM | POA: Diagnosis not present

## 2019-09-03 DIAGNOSIS — M25561 Pain in right knee: Secondary | ICD-10-CM | POA: Diagnosis present
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
## 2019-10-13 ENCOUNTER — Other Ambulatory Visit: Payer: Self-pay

## 2019-10-13 ENCOUNTER — Encounter: Payer: Self-pay | Admitting: Physical Therapy

## 2019-10-13 ENCOUNTER — Ambulatory Visit: Payer: Medicaid Other | Attending: Family Medicine | Admitting: Physical Therapy

## 2019-10-13 DIAGNOSIS — M25561 Pain in right knee: Secondary | ICD-10-CM | POA: Diagnosis not present

## 2019-10-13 DIAGNOSIS — M6281 Muscle weakness (generalized): Secondary | ICD-10-CM | POA: Diagnosis present

## 2019-10-13 DIAGNOSIS — M25532 Pain in left wrist: Secondary | ICD-10-CM | POA: Diagnosis present
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
## 2019-10-27 ENCOUNTER — Ambulatory Visit: Payer: Medicaid Other | Admitting: Physical Therapy

## 2019-10-29 ENCOUNTER — Other Ambulatory Visit: Payer: Self-pay

## 2019-10-29 ENCOUNTER — Ambulatory Visit: Payer: Medicaid Other | Admitting: Physical Therapy

## 2019-10-29 ENCOUNTER — Encounter: Payer: Self-pay | Admitting: Physical Therapy

## 2019-10-29 DIAGNOSIS — M25532 Pain in left wrist: Secondary | ICD-10-CM

## 2019-10-29 DIAGNOSIS — M6281 Muscle weakness (generalized): Secondary | ICD-10-CM

## 2019-10-29 DIAGNOSIS — M25561 Pain in right knee: Secondary | ICD-10-CM
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
## 2019-11-03 ENCOUNTER — Encounter: Payer: Medicaid Other | Admitting: Physical Therapy

## 2019-11-05 ENCOUNTER — Other Ambulatory Visit: Payer: Self-pay

## 2019-11-05 ENCOUNTER — Encounter: Payer: Self-pay | Admitting: Physical Therapy

## 2019-11-05 ENCOUNTER — Ambulatory Visit: Payer: Medicaid Other | Attending: Family Medicine | Admitting: Physical Therapy

## 2019-11-05 DIAGNOSIS — M25561 Pain in right knee: Secondary | ICD-10-CM | POA: Diagnosis present

## 2019-11-05 DIAGNOSIS — M6281 Muscle weakness (generalized): Secondary | ICD-10-CM | POA: Diagnosis present

## 2019-11-05 DIAGNOSIS — M25532 Pain in left wrist: Secondary | ICD-10-CM
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
## 2020-02-15 ENCOUNTER — Ambulatory Visit (INDEPENDENT_AMBULATORY_CARE_PROVIDER_SITE_OTHER): Payer: Medicaid Other | Admitting: Family Medicine

## 2020-02-15 ENCOUNTER — Encounter: Payer: Self-pay | Admitting: Family Medicine

## 2020-02-15 ENCOUNTER — Ambulatory Visit (HOSPITAL_BASED_OUTPATIENT_CLINIC_OR_DEPARTMENT_OTHER)
Admission: RE | Admit: 2020-02-15 | Discharge: 2020-02-15 | Disposition: A | Discharge status: Home / Self Care | Payer: Medicaid Other | Source: Ambulatory Visit | Attending: Family Medicine | Admitting: Family Medicine

## 2020-02-15 ENCOUNTER — Ambulatory Visit: Payer: Self-pay

## 2020-02-15 ENCOUNTER — Other Ambulatory Visit: Payer: Self-pay

## 2020-02-15 VITALS — BP 121/86 | Ht 64.0 in | Wt 145.0 lb

## 2020-02-15 DIAGNOSIS — G8929 Other chronic pain: Secondary | ICD-10-CM

## 2020-02-15 DIAGNOSIS — M25561 Pain in right knee: Secondary | ICD-10-CM | POA: Insufficient documentation

## 2020-02-15 DIAGNOSIS — M25512 Pain in left shoulder: Secondary | ICD-10-CM | POA: Insufficient documentation

## 2020-02-15 DIAGNOSIS — S82141A Displaced bicondylar fracture of right tibia, initial encounter for closed fracture: Secondary | ICD-10-CM | POA: Diagnosis not present

## 2020-02-15 DIAGNOSIS — M7542 Impingement syndrome of left shoulder: Secondary | ICD-10-CM

## 2020-02-15 NOTE — Patient Instructions (Signed)
Good to see you Please try ice  Please try the pennsaid  I will call with the results from today   Please send me a message in MyChart with any questions or updates. We will do a virtual visit once the MRI is resutled.   --Dr. Jordan Likes

## 2020-02-15 NOTE — Progress Notes (Signed)
Medication Samples have been provided to the patient.  Drug name: Pennsaid       Strength: 2%        Qty: 2 boxes  LOT: [id number]  Exp.Date: 08/2020  Dosing instructions: use a pea size amount and rub gently.  The patient has been instructed regarding the correct time, dose, and frequency of taking this medication, including desired effects and most common side effects.   Kathi Simpers, [place] 4:50 PM 02/15/2020

## 2020-02-15 NOTE — Progress Notes (Signed)
Crystal Gross - 54 y.o. female MRN [id number]  Date of birth: [birth year]  SUBJECTIVE:  Including CC & ROS.  Chief Complaint  Patient presents with  . Shoulder Pain    left  . Knee Pain    right    Crystal Gross is a 54 y.o. female that is presenting with acute on chronic right knee and left shoulder pain.  She had a window fall on her in October of last year.  Since that time the knee pain and left shoulder have been ongoing.  She is tried rest of therapy with limited improvement.  The right knee pain is anterior in nature.  The left shoulder seems to be all-encompassing.  No history of surgery.   Review of Systems See HPI   HISTORY: Past Medical, Surgical, Social, and Family History Reviewed & Updated per EMR.   Pertinent Historical Findings include:  Past Medical History:  Diagnosis Date  . Anxiety   . UTI (lower urinary tract infection)     Past Surgical History:  Procedure Laterality Date  . APPENDECTOMY      Family History  Problem Relation Age of Onset  . Diabetes Mother   . Hypertension Mother   . Stroke Mother   . Heart failure Father   . Migraines Father     Social History   Socioeconomic History  . Marital status: Single    Spouse name: Not on file  . Number of children: Not on file  . Years of education: Not on file  . Highest education level: Not on file  Occupational History  . Not on file  Tobacco Use  . Smoking status: Never Smoker  . Smokeless tobacco: Never Used  Substance and Sexual Activity  . Alcohol use: No  . Drug use: No  . Sexual activity: Yes    Birth control/protection: None, Condom  Other Topics Concern  . Not on file  Social History Narrative   ** Merged History Encounter **       Social Determinants of Health   Financial Resource Strain:   . Difficulty of Paying Living Expenses: Not on file  Food Insecurity:   . Worried About Programme researcher, broadcasting/film/video in the Last Year: Not on file  . Ran Out of Food in the Last  Year: Not on file  Transportation Needs:   . Lack of Transportation (Medical): Not on file  . Lack of Transportation (Non-Medical): Not on file  Physical Activity:   . Days of Exercise per Week: Not on file  . Minutes of Exercise per Session: Not on file  Stress:   . Feeling of Stress : Not on file  Social Connections:   . Frequency of Communication with Friends and Family: Not on file  . Frequency of Social Gatherings with Friends and Family: Not on file  . Attends Religious Services: Not on file  . Active Member of Clubs or Organizations: Not on file  . Attends Banker Meetings: Not on file  . Marital Status: Not on file  Intimate Partner Violence:   . Fear of Current or Ex-Partner: Not on file  . Emotionally Abused: Not on file  . Physically Abused: Not on file  . Sexually Abused: Not on file     PHYSICAL EXAM:  VS: BP 121/86   Ht 5\' 4"  (1.626 m)   Wt 145 lb (65.8 kg)   LMP 07/19/2016 (Approximate)   BMI 24.89 kg/m  Physical Exam Gen: NAD, alert, cooperative  with exam, well-appearing MSK:  Right knee: No obvious effusion. Normal range of motion. Tenderness to palpation over the anterior proximal tibia. No instability. Left shoulder. Normal external rotation. Normal external rotation and abduction. Normal empty can test. Neurovascularly intact  Limited ultrasound: Right knee, left shoulder:  Right knee: No obvious effusion. Mild spurring of the superior pole of patella at the insertion of the quadricep tendon. Hypoechoic change overlying the subcutaneous tissue of the patellar tendon from history of trauma. Increased hyperemia at the tibial plateau to suggest bony changes  Left shoulder. Normal-appearing biceps tendon. Normal-appearing subscapularis. No changes observed on dynamic testing of the supraspinatus.  Increased hyperemia at the inferior portion of the acromion to suggest impingement.  Summary: Bony changes of the proximal patella to  suggest irritation.  Left shoulder with signs of impingement.  Ultrasound and interpretation by Clare Gandy, MD    ASSESSMENT & PLAN:   Closed fracture of right tibial plateau Pain has been ongoing for the past year.  Does seem to have bony changes on ultrasound.  She did have a large window fall on top of her head in October last year. -Counseled on supportive care. -X-ray. -MRI to evaluate for occult fracture.  Shoulder impingement syndrome, left The window did fall on her shoulder as well in October of last year.  She does have some joint irritation but has good range of motion.  May have developed an impingement as a subsequent result of the injury. -Counseled on home exercise therapy and supportive care. -X-ray. -Provided Pennsaid sample. -Could consider injection or physical therapy.

## 2020-02-16 ENCOUNTER — Telehealth: Payer: Self-pay | Admitting: Family Medicine

## 2020-02-16 DIAGNOSIS — M222X1 Patellofemoral disorders, right knee: Secondary | ICD-10-CM | POA: Insufficient documentation

## 2020-02-16 DIAGNOSIS — M7542 Impingement syndrome of left shoulder: Secondary | ICD-10-CM | POA: Insufficient documentation

## 2020-02-16 NOTE — Telephone Encounter (Signed)
Left VM for patient. If she calls back please have her speak with a nurse/CMA and inform that her xrays were normal.   If any questions then please take the best time and phone number to call and I will try to call her back.   Myra Rude, MD [facility] 02/16/2020, 4:31 PM

## 2020-02-16 NOTE — Assessment & Plan Note (Addendum)
The window did fall on her shoulder as well in October of last year.  She does have some joint irritation but has good range of motion.  May have developed an impingement as a subsequent result of the injury. -Counseled on home exercise therapy and supportive care. -X-ray. -Provided Pennsaid sample. -Could consider injection or physical therapy.

## 2020-02-16 NOTE — Assessment & Plan Note (Signed)
Pain has been ongoing for the past year.  Does seem to have bony changes on ultrasound.  She did have a large window fall on top of her head in October last year. -Counseled on supportive care. -X-ray. -MRI to evaluate for occult fracture.

## 2020-03-01 ENCOUNTER — Other Ambulatory Visit: Payer: Self-pay | Admitting: Physician Assistant

## 2020-03-01 DIAGNOSIS — Z1231 Encounter for screening mammogram for malignant neoplasm of breast: Secondary | ICD-10-CM

## 2020-03-09 ENCOUNTER — Ambulatory Visit
Admission: RE | Admit: 2020-03-09 | Discharge: 2020-03-09 | Disposition: A | Discharge status: Home / Self Care | Payer: Medicaid Other | Source: Ambulatory Visit | Attending: Family Medicine | Admitting: Family Medicine

## 2020-03-09 ENCOUNTER — Other Ambulatory Visit: Payer: Self-pay

## 2020-03-09 DIAGNOSIS — S82141A Displaced bicondylar fracture of right tibia, initial encounter for closed fracture: Secondary | ICD-10-CM

## 2020-03-10 ENCOUNTER — Telehealth (INDEPENDENT_AMBULATORY_CARE_PROVIDER_SITE_OTHER): Payer: Medicaid Other | Admitting: Family Medicine

## 2020-03-10 DIAGNOSIS — M222X1 Patellofemoral disorders, right knee: Secondary | ICD-10-CM | POA: Diagnosis not present
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
## 2020-03-10 NOTE — Progress Notes (Addendum)
Virtual Visit via Telephone Note  I connected with Crystal Gross on 03/16/20 at  3:50 PM EDT by telephone and verified that I am speaking with the correct person using two identifiers.   I discussed the limitations, risks, security and privacy concerns of performing an evaluation and management service by telephone and the availability of in person appointments. I also discussed with the patient that there may be a patient responsible charge related to this service. The patient expressed understanding and agreed to proceed.  Patient: home  Physician: office   History of Present Illness:  Crystal Gross is a 54 yo F that is following up after an MRI of her right knee. The MRI is normal.    Observations/Objective:   Assessment and Plan:  Patellofemoral pain syndrome: Has had ongoing symptoms after her trauma last year.  MRI was normal with no structural changes appreciated.  She may have more of acute mechanical change with maltracking  -Counseled on supportive care. -Referral to physical therapy. -Could consider injection  Follow Up Instructions:    I discussed the assessment and treatment plan with the patient. The patient was provided an opportunity to ask questions and all were answered. The patient agreed with the plan and demonstrated an understanding of the instructions.   The patient was advised to call back or seek an in-person evaluation if the symptoms worsen or if the condition fails to improve as anticipated.  I provided 5 minutes of non-face-to-face time during this encounter.   Clare Gandy, MD

## 2020-03-22 ENCOUNTER — Ambulatory Visit: Payer: Medicaid Other | Attending: Family Medicine | Admitting: Physical Therapy

## 2020-03-22 ENCOUNTER — Other Ambulatory Visit: Payer: Self-pay

## 2020-03-22 DIAGNOSIS — M6281 Muscle weakness (generalized): Secondary | ICD-10-CM

## 2020-03-22 DIAGNOSIS — R262 Difficulty in walking, not elsewhere classified: Secondary | ICD-10-CM | POA: Diagnosis present

## 2020-03-22 DIAGNOSIS — G8929 Other chronic pain: Secondary | ICD-10-CM | POA: Diagnosis present

## 2020-03-22 DIAGNOSIS — M25561 Pain in right knee: Secondary | ICD-10-CM | POA: Diagnosis present
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
## 2020-03-22 NOTE — Patient Instructions (Signed)
    Home exercise program created by Caterin Tabares, PT.  For questions, please contact Uniqua Kihn via phone at [phone] or email at [email or web address]   [facility] [place] [address]  [address] [place], [place], [address] Phone: [phone]   Fax:  [phone]    

## 2020-03-24 ENCOUNTER — Other Ambulatory Visit: Payer: Self-pay

## 2020-03-24 ENCOUNTER — Ambulatory Visit: Payer: Medicaid Other | Admitting: Physical Therapy

## 2020-03-24 ENCOUNTER — Encounter: Payer: Self-pay | Admitting: Physical Therapy

## 2020-03-24 DIAGNOSIS — M25561 Pain in right knee: Secondary | ICD-10-CM

## 2020-03-24 DIAGNOSIS — G8929 Other chronic pain: Secondary | ICD-10-CM

## 2020-03-24 DIAGNOSIS — R262 Difficulty in walking, not elsewhere classified: Secondary | ICD-10-CM

## 2020-03-24 DIAGNOSIS — M6281 Muscle weakness (generalized): Secondary | ICD-10-CM
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
## 2020-03-24 NOTE — Patient Instructions (Signed)
    Home exercise program created by Fathima Bartl, PT.  For questions, please contact Lilleigh Hechavarria via phone at [phone] or email at [email or web address]  [facility] [place] [address]  [address] [place], [place], [address] Phone: [phone]   Fax:  [phone]    

## 2020-03-28 ENCOUNTER — Ambulatory Visit: Payer: Medicaid Other

## 2020-03-28 ENCOUNTER — Other Ambulatory Visit: Payer: Self-pay

## 2020-03-28 DIAGNOSIS — G8929 Other chronic pain: Secondary | ICD-10-CM

## 2020-03-28 DIAGNOSIS — R262 Difficulty in walking, not elsewhere classified: Secondary | ICD-10-CM

## 2020-03-28 DIAGNOSIS — M6281 Muscle weakness (generalized): Secondary | ICD-10-CM

## 2020-03-28 DIAGNOSIS — M25561 Pain in right knee: Secondary | ICD-10-CM | POA: Diagnosis not present
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
## 2020-03-31 ENCOUNTER — Ambulatory Visit: Payer: Medicaid Other

## 2020-03-31 DIAGNOSIS — R262 Difficulty in walking, not elsewhere classified: Secondary | ICD-10-CM

## 2020-03-31 DIAGNOSIS — M6281 Muscle weakness (generalized): Secondary | ICD-10-CM

## 2020-03-31 DIAGNOSIS — G8929 Other chronic pain: Secondary | ICD-10-CM

## 2020-03-31 DIAGNOSIS — M25561 Pain in right knee: Secondary | ICD-10-CM | POA: Diagnosis not present
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
## 2020-04-04 ENCOUNTER — Other Ambulatory Visit: Payer: Self-pay

## 2020-04-04 ENCOUNTER — Encounter: Payer: Self-pay | Admitting: Physical Therapy

## 2020-04-04 ENCOUNTER — Ambulatory Visit: Payer: Medicaid Other | Attending: Family Medicine | Admitting: Physical Therapy

## 2020-04-04 DIAGNOSIS — G8929 Other chronic pain: Secondary | ICD-10-CM | POA: Insufficient documentation

## 2020-04-04 DIAGNOSIS — M6281 Muscle weakness (generalized): Secondary | ICD-10-CM | POA: Insufficient documentation

## 2020-04-04 DIAGNOSIS — M25561 Pain in right knee: Secondary | ICD-10-CM | POA: Diagnosis not present

## 2020-04-04 DIAGNOSIS — M25532 Pain in left wrist: Secondary | ICD-10-CM | POA: Insufficient documentation

## 2020-04-04 DIAGNOSIS — R262 Difficulty in walking, not elsewhere classified: Secondary | ICD-10-CM
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
## 2020-04-04 NOTE — Patient Instructions (Signed)
   Kinesiology tape  What is kinesiology tape?  There are many brands of kinesiology tape. KTape, Rock Tape, Body Sport, Dynamic tape, to name a few.  It is an elasticized tape designed to support the body's natural healing process. This tape provides stability and support to muscles and joints without restricting motion.  It can also help decrease swelling in the area of application.  How does it work?  The tape microscopically lifts and decompresses the skin to allow for drainage of lymph (swelling) to flow away from area, reducing inflammation. The tape has the ability to help re-educate the neuromuscular system by targeting specific receptors in the skin. The presence of the tape increases the body's awareness of posture and body mechanics.  Do not use with:  . Open wounds . Skin lesions . Adhesive allergies  In some rare cases, mild/moderate skin irritation can occur. This can include redness, itchiness, or hives. If this occurs, immediately remove tape and consult your primary care physician if symptoms are severe or do not resolve within 2 days.  Safe removal of the tape:  To remove tape safely, hold nearby skin with one hand and gentle roll tape down with other hand. You can apply oil or conditioner to tape while in shower prior to removal to loosen adhesive. DO NOT swiftly rip tape off like a band-aid, as this could cause skin tears and additional skin irritation.     For questions, please contact your therapist at:  [facility] [place] [address]  [address] [place], [place], [address] Phone: [phone]   Fax:  [phone]     

## 2020-04-05 ENCOUNTER — Encounter: Payer: Self-pay | Admitting: Physical Therapy

## 2020-04-05 ENCOUNTER — Ambulatory Visit: Payer: Medicaid Other | Admitting: Physical Therapy

## 2020-04-05 DIAGNOSIS — G8929 Other chronic pain: Secondary | ICD-10-CM

## 2020-04-05 DIAGNOSIS — M6281 Muscle weakness (generalized): Secondary | ICD-10-CM

## 2020-04-05 DIAGNOSIS — M25561 Pain in right knee: Secondary | ICD-10-CM | POA: Diagnosis not present

## 2020-04-05 DIAGNOSIS — R262 Difficulty in walking, not elsewhere classified: Secondary | ICD-10-CM

## 2020-04-05 NOTE — Patient Instructions (Signed)
    Home exercise program created by Josemiguel Gries, PT.  For questions, please contact Timohty Renbarger via phone at [phone] or email at [email or web address]  [facility] [place] [address]  [address] [place], [place], [address] Phone: [phone]   Fax:  [phone]    
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
## 2020-04-07 ENCOUNTER — Ambulatory Visit
Admission: RE | Admit: 2020-04-07 | Discharge: 2020-04-07 | Disposition: A | Discharge status: Home / Self Care | Payer: Medicaid Other | Source: Ambulatory Visit | Attending: Physician Assistant | Admitting: Physician Assistant

## 2020-04-07 ENCOUNTER — Other Ambulatory Visit: Payer: Self-pay

## 2020-04-07 DIAGNOSIS — Z1231 Encounter for screening mammogram for malignant neoplasm of breast: Secondary | ICD-10-CM

## 2020-04-13 ENCOUNTER — Ambulatory Visit: Payer: Medicaid Other

## 2020-04-13 ENCOUNTER — Other Ambulatory Visit: Payer: Self-pay

## 2020-04-13 DIAGNOSIS — M25561 Pain in right knee: Secondary | ICD-10-CM | POA: Diagnosis not present

## 2020-04-13 DIAGNOSIS — M6281 Muscle weakness (generalized): Secondary | ICD-10-CM

## 2020-04-13 DIAGNOSIS — G8929 Other chronic pain: Secondary | ICD-10-CM

## 2020-04-13 DIAGNOSIS — R262 Difficulty in walking, not elsewhere classified: Secondary | ICD-10-CM
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
## 2020-04-18 ENCOUNTER — Other Ambulatory Visit: Payer: Self-pay

## 2020-04-18 ENCOUNTER — Encounter: Payer: Self-pay | Admitting: Physical Therapy

## 2020-04-18 ENCOUNTER — Ambulatory Visit: Payer: Medicaid Other | Admitting: Physical Therapy

## 2020-04-18 DIAGNOSIS — G8929 Other chronic pain: Secondary | ICD-10-CM

## 2020-04-18 DIAGNOSIS — M6281 Muscle weakness (generalized): Secondary | ICD-10-CM

## 2020-04-18 DIAGNOSIS — R262 Difficulty in walking, not elsewhere classified: Secondary | ICD-10-CM

## 2020-04-18 DIAGNOSIS — M25561 Pain in right knee: Secondary | ICD-10-CM | POA: Diagnosis not present
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
## 2020-04-18 NOTE — Patient Instructions (Signed)
    Home exercise program created by Jemel Ono, PT.  For questions, please contact Demarri Elie via phone at [phone] or email at [email or web address]  [place] [facility] [place] [address]  [address] [place], [place], [address] Phone: [phone]   Fax:  [phone]    

## 2020-04-21 ENCOUNTER — Ambulatory Visit: Payer: Medicaid Other

## 2020-04-21 ENCOUNTER — Other Ambulatory Visit: Payer: Self-pay

## 2020-04-21 DIAGNOSIS — R262 Difficulty in walking, not elsewhere classified: Secondary | ICD-10-CM

## 2020-04-21 DIAGNOSIS — G8929 Other chronic pain: Secondary | ICD-10-CM

## 2020-04-21 DIAGNOSIS — M25561 Pain in right knee: Secondary | ICD-10-CM

## 2020-04-21 DIAGNOSIS — M25532 Pain in left wrist: Secondary | ICD-10-CM

## 2020-04-21 DIAGNOSIS — M6281 Muscle weakness (generalized): Secondary | ICD-10-CM
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
## 2020-04-25 ENCOUNTER — Other Ambulatory Visit: Payer: Self-pay

## 2020-04-25 ENCOUNTER — Ambulatory Visit: Payer: Medicaid Other

## 2020-04-25 DIAGNOSIS — M25561 Pain in right knee: Secondary | ICD-10-CM | POA: Diagnosis not present

## 2020-04-25 DIAGNOSIS — M25532 Pain in left wrist: Secondary | ICD-10-CM

## 2020-04-25 DIAGNOSIS — M6281 Muscle weakness (generalized): Secondary | ICD-10-CM

## 2020-04-25 DIAGNOSIS — R262 Difficulty in walking, not elsewhere classified: Secondary | ICD-10-CM

## 2020-04-25 DIAGNOSIS — G8929 Other chronic pain: Secondary | ICD-10-CM
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
## 2020-05-02 ENCOUNTER — Encounter: Payer: Self-pay | Admitting: Physical Therapy

## 2020-05-02 ENCOUNTER — Ambulatory Visit: Payer: Medicaid Other | Admitting: Physical Therapy

## 2020-05-02 ENCOUNTER — Other Ambulatory Visit: Payer: Self-pay

## 2020-05-02 DIAGNOSIS — M6281 Muscle weakness (generalized): Secondary | ICD-10-CM

## 2020-05-02 DIAGNOSIS — R262 Difficulty in walking, not elsewhere classified: Secondary | ICD-10-CM

## 2020-05-02 DIAGNOSIS — G8929 Other chronic pain: Secondary | ICD-10-CM

## 2020-05-02 DIAGNOSIS — M25561 Pain in right knee: Secondary | ICD-10-CM | POA: Diagnosis not present
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
## 2020-05-05 ENCOUNTER — Ambulatory Visit: Payer: Medicaid Other | Attending: Family Medicine

## 2020-05-05 ENCOUNTER — Other Ambulatory Visit: Payer: Self-pay

## 2020-05-05 DIAGNOSIS — M6281 Muscle weakness (generalized): Secondary | ICD-10-CM | POA: Diagnosis present

## 2020-05-05 DIAGNOSIS — R262 Difficulty in walking, not elsewhere classified: Secondary | ICD-10-CM | POA: Insufficient documentation

## 2020-05-05 DIAGNOSIS — G8929 Other chronic pain: Secondary | ICD-10-CM | POA: Insufficient documentation

## 2020-05-05 DIAGNOSIS — M25561 Pain in right knee: Secondary | ICD-10-CM | POA: Insufficient documentation
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
## 2020-05-11 ENCOUNTER — Other Ambulatory Visit: Payer: Self-pay

## 2020-05-11 ENCOUNTER — Ambulatory Visit: Payer: Medicaid Other

## 2020-05-11 DIAGNOSIS — G8929 Other chronic pain: Secondary | ICD-10-CM

## 2020-05-11 DIAGNOSIS — M6281 Muscle weakness (generalized): Secondary | ICD-10-CM

## 2020-05-11 DIAGNOSIS — M25561 Pain in right knee: Secondary | ICD-10-CM

## 2020-05-11 DIAGNOSIS — R262 Difficulty in walking, not elsewhere classified: Secondary | ICD-10-CM
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
## 2020-05-16 ENCOUNTER — Ambulatory Visit: Payer: Medicaid Other | Admitting: Physical Therapy

## 2020-05-16 ENCOUNTER — Encounter: Payer: Self-pay | Admitting: Physical Therapy

## 2020-05-16 ENCOUNTER — Other Ambulatory Visit: Payer: Self-pay

## 2020-05-16 DIAGNOSIS — M25561 Pain in right knee: Secondary | ICD-10-CM | POA: Diagnosis not present

## 2020-05-16 DIAGNOSIS — R262 Difficulty in walking, not elsewhere classified: Secondary | ICD-10-CM

## 2020-05-16 DIAGNOSIS — G8929 Other chronic pain: Secondary | ICD-10-CM

## 2020-05-16 DIAGNOSIS — M6281 Muscle weakness (generalized): Secondary | ICD-10-CM
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
## 2020-05-23 ENCOUNTER — Encounter (INDEPENDENT_AMBULATORY_CARE_PROVIDER_SITE_OTHER): Payer: Self-pay

## 2020-05-23 ENCOUNTER — Ambulatory Visit: Payer: Self-pay

## 2020-05-23 ENCOUNTER — Other Ambulatory Visit: Payer: Self-pay

## 2020-05-23 ENCOUNTER — Ambulatory Visit (INDEPENDENT_AMBULATORY_CARE_PROVIDER_SITE_OTHER): Payer: Medicaid Other | Admitting: Family Medicine

## 2020-05-23 VITALS — BP 130/74 | Ht 64.0 in | Wt 155.0 lb

## 2020-05-23 DIAGNOSIS — M7542 Impingement syndrome of left shoulder: Secondary | ICD-10-CM | POA: Diagnosis present

## 2020-05-23 DIAGNOSIS — M222X1 Patellofemoral disorders, right knee: Secondary | ICD-10-CM | POA: Diagnosis not present

## 2020-05-23 MED ORDER — PREDNISONE 5 MG PO TABS: ORAL_TABLET | ORAL | 0 refills | Status: DC

## 2020-05-23 NOTE — Patient Instructions (Signed)
Good to see you ?Please try ice as needed  ?Please try the exercises   ?Please send me a message in MyChart with any questions or updates.  ?Please see me back in 4-6 weeks.  ? ?--Dr. Neyda Durango ? ?

## 2020-05-23 NOTE — Assessment & Plan Note (Signed)
Has mild impingement and subacromial bursitis.  She also has an AC joint effusion. -Counseled on home exercise therapy and supportive care. -Prednisone. -Could consider further imaging or MRI.

## 2020-05-23 NOTE — Assessment & Plan Note (Signed)
Still having abnormal sensation around the knee. Unclear if this is fat pad related or more superficial. MRI was normal.  - could consider injection or gabapentin.

## 2020-05-23 NOTE — Progress Notes (Signed)
  Crystal Gross - 54 y.o. female MRN [id number]  Date of birth: 11/17/1965  SUBJECTIVE:  Including CC & ROS.  No chief complaint on file.   Crystal Gross is a 54 y.o. female that is presenting with acute worsening of her left shoulder pain.  She has pain with abduction and symptoms seem to localize to the shoulder.  She has a burning sensation intermittently.  She also has altered sensation around the knee.  She has gotten improvement with physical therapy.   Review of Systems See HPI   HISTORY: Past Medical, Surgical, Social, and Family History Reviewed & Updated per EMR.   Pertinent Historical Findings include:  Past Medical History:  Diagnosis Date  . Anxiety   . UTI (lower urinary tract infection)     Past Surgical History:  Procedure Laterality Date  . APPENDECTOMY      Family History  Problem Relation Age of Onset  . Diabetes Mother   . Hypertension Mother   . Stroke Mother   . Heart failure Father   . Migraines Father     Social History   Socioeconomic History  . Marital status: Single    Spouse name: Not on file  . Number of children: Not on file  . Years of education: Not on file  . Highest education level: Not on file  Occupational History  . Not on file  Tobacco Use  . Smoking status: Never Smoker  . Smokeless tobacco: Never Used  Substance and Sexual Activity  . Alcohol use: No  . Drug use: No  . Sexual activity: Yes    Birth control/protection: None, Condom  Other Topics Concern  . Not on file  Social History Narrative   ** Merged History Encounter **       Social Determinants of Health   Financial Resource Strain: Not on file  Food Insecurity: Not on file  Transportation Needs: Not on file  Physical Activity: Not on file  Stress: Not on file  Social Connections: Not on file  Intimate Partner Violence: Not on file     PHYSICAL EXAM:  VS: BP 130/74   Ht 5\' 4"  (1.626 m)   Wt 155 lb (70.3 kg)   LMP 07/19/2016  (Approximate)   BMI 26.61 kg/m  Physical Exam Gen: NAD, alert, cooperative with exam, well-appearing MSK:  Left shoulder: Normal range of motion. Normal strength resistance. Negative empty can test. Some pain with crossarm testing Neurovascularly intact  Limited ultrasound: Left shoulder:  Normal-appearing biceps tendon. Normal-appearing subscapularis. Supraspinatus with some impingement symptoms on dynamic testing. Effusion noted the [facility] joint. Normal-appearing posterior glenohumeral joint.  Summary: Impingement signs as well as AC joint effusion.  Ultrasound and interpretation by [facility], MD    ASSESSMENT & PLAN:   Patellofemoral pain syndrome of right knee Still having abnormal sensation around the knee. Unclear if this is fat pad related or more superficial. MRI was normal.  - could consider injection or gabapentin.   Shoulder impingement syndrome, left Has mild impingement and subacromial bursitis.  She also has an AC joint effusion. -Counseled on home exercise therapy and supportive care. -Prednisone. -Could consider further imaging or MRI.

## 2020-07-05 ENCOUNTER — Other Ambulatory Visit: Payer: Self-pay

## 2020-07-05 ENCOUNTER — Ambulatory Visit (INDEPENDENT_AMBULATORY_CARE_PROVIDER_SITE_OTHER): Payer: Medicaid Other | Admitting: Family Medicine

## 2020-07-05 DIAGNOSIS — M7542 Impingement syndrome of left shoulder: Secondary | ICD-10-CM | POA: Diagnosis not present

## 2020-07-05 DIAGNOSIS — M222X1 Patellofemoral disorders, right knee: Secondary | ICD-10-CM | POA: Diagnosis present

## 2020-07-05 MED ORDER — PREDNISONE 5 MG PO TABS: ORAL_TABLET | ORAL | 0 refills | Status: DC

## 2020-07-05 NOTE — Patient Instructions (Signed)
Good to see you Sorry to hear about your cat.  Please continue the range of motion exercises   Please send me a message in MyChart with any questions or updates.  Please see me back in 6-8 weeks or as needed.   --Dr. Jordan Likes

## 2020-07-05 NOTE — Assessment & Plan Note (Signed)
Still seems to be impingement in nature.  Has gotten improvement with prednisone. -Counseled on home exercise therapy and supportive care. -Prednisone. -Could consider injection or physical therapy.

## 2020-07-05 NOTE — Progress Notes (Signed)
  Crystal Gross - 55 y.o. female MRN [id number]  Date of birth: May 22, 1966  SUBJECTIVE:  Including CC & ROS.  No chief complaint on file.   Crystal Gross is a 55 y.o. female that is following up for her right knee and left shoulder.  She is still experiencing pain in the left shoulder.  She has altered sensation overlying the knee.   Review of Systems See HPI   HISTORY: Past Medical, Surgical, Social, and Family History Reviewed & Updated per EMR.   Pertinent Historical Findings include:  Past Medical History:  Diagnosis Date  . Anxiety   . UTI (lower urinary tract infection)     Past Surgical History:  Procedure Laterality Date  . APPENDECTOMY      Family History  Problem Relation Age of Onset  . Diabetes Mother   . Hypertension Mother   . Stroke Mother   . Heart failure Father   . Migraines Father     Social History   Socioeconomic History  . Marital status: Single    Spouse name: Not on file  . Number of children: Not on file  . Years of education: Not on file  . Highest education level: Not on file  Occupational History  . Not on file  Tobacco Use  . Smoking status: Never Smoker  . Smokeless tobacco: Never Used  Substance and Sexual Activity  . Alcohol use: No  . Drug use: No  . Sexual activity: Yes    Birth control/protection: None, Condom  Other Topics Concern  . Not on file  Social History Narrative   ** Merged History Encounter **       Social Determinants of Health   Financial Resource Strain: Not on file  Food Insecurity: Not on file  Transportation Needs: Not on file  Physical Activity: Not on file  Stress: Not on file  Social Connections: Not on file  Intimate Partner Violence: Not on file     PHYSICAL EXAM:  VS: BP 138/76   Ht 5\' 4"  (1.626 m)   Wt 155 lb (70.3 kg)   LMP 07/19/2016 (Approximate)   BMI 26.61 kg/m  Physical Exam Gen: NAD, alert, cooperative with exam, well-appearing   ASSESSMENT & PLAN:    Patellofemoral pain syndrome of right knee Still having altered sensation overlying the patellar tendon. -Counseled on home exercise therapy and supportive care. -Could consider ESWT  Shoulder impingement syndrome, left Still seems to be impingement in nature.  Has gotten improvement with prednisone. -Counseled on home exercise therapy and supportive care. -Prednisone. -Could consider injection or physical therapy.

## 2020-07-05 NOTE — Assessment & Plan Note (Signed)
Still having altered sensation overlying the patellar tendon. -Counseled on home exercise therapy and supportive care. -Could consider ESWT

## 2020-08-03 ENCOUNTER — Other Ambulatory Visit (HOSPITAL_COMMUNITY)
Admission: RE | Admit: 2020-08-03 | Discharge: 2020-08-03 | Disposition: A | Discharge status: Home / Self Care | Payer: Medicaid Other | Source: Ambulatory Visit | Attending: Obstetrics and Gynecology | Admitting: Obstetrics and Gynecology

## 2020-08-03 ENCOUNTER — Other Ambulatory Visit: Payer: Self-pay

## 2020-08-03 ENCOUNTER — Encounter: Payer: Self-pay | Admitting: Women's Health

## 2020-08-03 ENCOUNTER — Ambulatory Visit (INDEPENDENT_AMBULATORY_CARE_PROVIDER_SITE_OTHER): Payer: Medicaid Other | Admitting: Women's Health

## 2020-08-03 ENCOUNTER — Ambulatory Visit: Payer: Medicaid Other | Admitting: Obstetrics and Gynecology

## 2020-08-03 VITALS — BP 119/78 | HR 107 | Ht 64.0 in | Wt 169.4 lb

## 2020-08-03 DIAGNOSIS — Z01419 Encounter for gynecological examination (general) (routine) without abnormal findings: Secondary | ICD-10-CM | POA: Diagnosis not present

## 2020-08-03 DIAGNOSIS — Z113 Encounter for screening for infections with a predominantly sexual mode of transmission: Secondary | ICD-10-CM

## 2020-08-03 DIAGNOSIS — R39198 Other difficulties with micturition: Secondary | ICD-10-CM | POA: Diagnosis not present

## 2020-08-03 NOTE — Progress Notes (Signed)
GYNECOLOGY ANNUAL PREVENTATIVE CARE ENCOUNTER NOTE  History:     Crystal Gross is a 55 y.o. [id number] female here for a routine annual gynecologic exam.  Current complaints: none.   Denies abnormal vaginal bleeding, discharge, pelvic pain, problems with intercourse or other gynecologic concerns. Pt accepts STD testing today. Pt reports she does perform SBE. Pt reports bowel or bladder concerns. Patient states sometimes she and her sister have trouble starting their urine stream. She reports she is always able to urinate, but sometimes has difficulty with starting her stream. No family hx of breast, endometrial or ovarian cancer. Sister had colon cancer age, deceased age 60. Pt does not smoke, drink or use drugs.      Gynecologic History Patient's last menstrual period was 09/17/2014 (approximate). Last Pap: 09/2016. Results were: normal per EPIC. Pt denies history of abnormal Pap. Last mammogram: 04/2020. Results were: normal Last colooguard: 02/2019/ Colonoscopy: 12/2019 per pt. Results were: normal Postmenopausal bleeding? no  Obstetric History OB History  Gravida Para Term Preterm AB Living  3 3 3     3   SAB IAB Ectopic Multiple Live Births               # Outcome Date GA Lbr Len/2nd Weight Sex Delivery Anes PTL Lv  3 Term           2 Term           1 Term             Past Medical History:  Diagnosis Date  . Anxiety   . UTI (lower urinary tract infection)     Past Surgical History:  Procedure Laterality Date  . APPENDECTOMY    . COLONOSCOPY  2021    Current Outpatient Medications on File Prior to Visit  Medication Sig Dispense Refill  . Ascorbic Acid (VITAMIN C) 1000 MG tablet Take 1,000 mg by mouth 3 (three) times a week.     . Biotin 2022 MCG TABS Take 10,000 mcg by mouth 2 (two) times a week.    . hydroxypropyl methylcellulose / hypromellose (ISOPTO TEARS / GONIOVISC) 2.5 % ophthalmic solution Place 1 drop into both eyes 4 (four) times daily as needed  for dry eyes.    . Multiple Vitamins-Minerals (MULTIVITAMIN GUMMIES ADULT PO) Take 2 each by mouth daily.    . predniSONE (DELTASONE) 5 MG tablet Take 6 pills for first day, 5 pills second day, 4 pills third day, 3 pills fourth day, 2 pills the fifth day, and 1 pill sixth day. 21 tablet 0  . Zinc Sulfate (ZINC 15 PO) Take by mouth.    01093 ibuprofen (ADVIL,MOTRIN) 200 MG tablet Take 200 mg by mouth every 6 (six) hours as needed for fever, headache, mild pain or moderate pain.    . methocarbamol (ROBAXIN) 500 MG tablet Take 1 tablet (500 mg total) by mouth 2 (two) times daily. (Patient not taking: No sig reported) 20 tablet 0  . naproxen (NAPROSYN) 375 MG tablet Take 1 tablet (375 mg total) by mouth 2 (two) times daily. (Patient not taking: No sig reported) 20 tablet 0  . naproxen (NAPROSYN) 500 MG tablet Take 1 tablet (500 mg total) by mouth 2 (two) times daily. (Patient not taking: No sig reported) 20 tablet 0  . naproxen sodium (ANAPROX) 220 MG tablet Take 220 mg by mouth 2 (two) times daily as needed (for pain). (Patient not taking: Reported on 08/03/2020)     No current facility-administered medications  on file prior to visit.    No Known Allergies  Social History:  reports that she has never smoked. She has never used smokeless tobacco. She reports that she does not drink alcohol and does not use drugs.  Family History  Problem Relation Age of Onset  . Diabetes Mother   . Hypertension Mother   . Stroke Mother   . Heart failure Father   . Migraines Father     The following portions of the patient's history were reviewed and updated as appropriate: allergies, current medications, past family history, past medical history, past social history, past surgical history and problem list.  Review of Systems Pertinent items noted in HPI and remainder of comprehensive ROS otherwise negative.  Physical Exam:  BP 119/78   Pulse (!) 107   Ht 5\' 4"  (1.626 m)   Wt 169 lb 6.4 oz (76.8 kg)   LMP  07/19/2016 (Approximate)   BMI 29.08 kg/m  CONSTITUTIONAL: Well-developed, well-nourished female in no acute distress.  HENT:  Normocephalic, atraumatic, External right and left ear normal. EYES: Conjunctivae and EOM are normal. Pupils are equal, round, and reactive to light. No scleral icterus.  NECK: Normal range of motion, supple, no masses.  Normal thyroid.  SKIN: Skin is warm and dry. No rash noted. Not diaphoretic. No erythema. No pallor. MUSCULOSKELETAL: Normal range of motion. No tenderness.  No cyanosis, clubbing, or edema. NEUROLOGIC: Alert and oriented to person, place, and time. Normal reflexes, muscle tone coordination. PSYCHIATRIC: Normal mood and affect. Normal behavior. Normal judgment and thought content. CARDIOVASCULAR: Normal heart rate noted, regular rhythm. RESPIRATORY: Clear to auscultation bilaterally. Effort and breath sounds normal, no problems with respiration noted. BREASTS: Symmetric in size. No masses, skin changes, nipple drainage, or lymphadenopathy. ABDOMEN: Soft, normal bowel sounds, no distention noted.  No tenderness, rebound or guarding.  PELVIC: Normal appearing external genitalia; normal appearing vaginal mucosa for postmenopausal state and cervix.  No abnormal discharge noted.  Pap smear obtained.   Assessment and Plan:     1. Well woman exam - Cytology - PAP( [place])  2. Screening examination for sexually transmitted disease - HIV antibody (with reflex) - RPR - Hepatitis B Surface AntiGEN - Hepatitis C Antibody - Cervicovaginal ancillary only( [place])  3. Difficulty in micturition - Ambulatory referral to Urogynecology  Will follow up results of pap smear and other testing, if performed, and manage accordingly. Routine preventative health maintenance measures emphasized. Self-breast awareness taught, importance discussed, advised when to RTC, SBA literature given. Please refer to After Visit Summary for other counseling  recommendations.      07/21/2016, [facility] Nurse Practitioner, [facility]

## 2020-08-03 NOTE — Patient Instructions (Addendum)
Preventive Care 57-57 Years Old, Female Preventive care refers to lifestyle choices and visits with your health care provider that can promote health and wellness. This includes:  A yearly physical exam. This is also called an annual wellness visit.  Regular dental and eye exams.  Immunizations.  Screening for certain conditions.  Healthy lifestyle choices, such as: ? Eating a healthy diet. ? Getting regular exercise. ? Not using drugs or products that contain nicotine and tobacco. ? Limiting alcohol use. What can I expect for my preventive care visit? Physical exam Your health care provider will check your:  Height and weight. These may be used to calculate your BMI (body mass index). BMI is a measurement that tells if you are at a healthy weight.  Heart rate and blood pressure.  Body temperature.  Skin for abnormal spots. Counseling Your health care provider may ask you questions about your:  Past medical problems.  Family's medical history.  Alcohol, tobacco, and drug use.  Emotional well-being.  Home life and relationship well-being.  Sexual activity.  Diet, exercise, and sleep habits.  Work and work Statistician.  Access to firearms.  Method of birth control.  Menstrual cycle.  Pregnancy history. What immunizations do I need? Vaccines are usually given at various ages, according to a schedule. Your health care provider will recommend vaccines for you based on your age, medical history, and lifestyle or other factors, such as travel or where you work.   What tests do I need? Blood tests  Lipid and cholesterol levels. These may be checked every 5 years, or more often if you are over 3 years old.  Hepatitis C test.  Hepatitis B test. Screening  Lung cancer screening. You may have this screening every year starting at age 73 if you have a 30-pack-year history of smoking and currently smoke or have quit within the past 15 years.  Colorectal cancer  screening. ? All adults should have this screening starting at age 52 and continuing until age 17. ? Your health care provider may recommend screening at age 49 if you are at increased risk. ? You will have tests every 1-10 years, depending on your results and the type of screening test.  Diabetes screening. ? This is done by checking your blood sugar (glucose) after you have not eaten for a while (fasting). ? You may have this done every 1-3 years.  Mammogram. ? This may be done every 1-2 years. ? Talk with your health care provider about when you should start having regular mammograms. This may depend on whether you have a family history of breast cancer.  BRCA-related cancer screening. This may be done if you have a family history of breast, ovarian, tubal, or peritoneal cancers.  Pelvic exam and Pap test. ? This may be done every 3 years starting at age 10. ? Starting at age 11, this may be done every 5 years if you have a Pap test in combination with an HPV test. Other tests  STD (sexually transmitted disease) testing, if you are at risk.  Bone density scan. This is done to screen for osteoporosis. You may have this scan if you are at high risk for osteoporosis. Talk with your health care provider about your test results, treatment options, and if necessary, the need for more tests. Follow these instructions at home: Eating and drinking  Eat a diet that includes fresh fruits and vegetables, whole grains, lean protein, and low-fat dairy products.  Take vitamin and mineral supplements  as recommended by your health care provider.  Do not drink alcohol if: ? Your health care provider tells you not to drink. ? You are pregnant, may be pregnant, or are planning to become pregnant.  If you drink alcohol: ? Limit how much you have to 0-1 drink a day. ? Be aware of how much alcohol is in your drink. In the [place] one drink equals one 12 oz bottle of beer (355 mL), one 5 oz glass of  wine (148 mL), or one 1 oz glass of hard liquor (44 mL).   Lifestyle  Take daily care of your teeth and gums. Brush your teeth every morning and night with fluoride toothpaste. Floss one time each day.  Stay active. Exercise for at least 30 minutes 5 or more days each week.  Do not use any products that contain nicotine or tobacco, such as cigarettes, e-cigarettes, and chewing tobacco. If you need help quitting, ask your health care provider.  Do not use drugs.  If you are sexually active, practice safe sex. Use a condom or other form of protection to prevent STIs (sexually transmitted infections).  If you do not wish to become pregnant, use a form of birth control. If you plan to become pregnant, see your health care provider for a prepregnancy visit.  If told by your health care provider, take low-dose aspirin daily starting at age 50.  Find healthy ways to cope with stress, such as: ? Meditation, yoga, or listening to music. ? Journaling. ? Talking to a trusted person. ? Spending time with friends and family. Safety  Always wear your seat belt while driving or riding in a vehicle.  Do not drive: ? If you have been drinking alcohol. Do not ride with someone who has been drinking. ? When you are tired or distracted. ? While texting.  Wear a helmet and other protective equipment during sports activities.  If you have firearms in your house, make sure you follow all gun safety procedures. What's next?  Visit your health care provider once a year for an annual wellness visit.  Ask your health care provider how often you should have your eyes and teeth checked.  Stay up to date on all vaccines. This information is not intended to replace advice given to you by your health care provider. Make sure you discuss any questions you have with your health care provider. Document Revised: 02/23/2020 Document Reviewed: 01/30/2018 Elsevier Patient Education  2021 [employer]    Breast Self-Awareness Breast self-awareness means being familiar with how your breasts look and feel. It involves checking your breasts regularly and reporting any changes to your health care provider. Practicing breast self-awareness is important. Sometimes changes may not be harmful (are benign), but sometimes a change in your breasts can be a sign of a serious medical problem. It is important to learn how to do this procedure correctly so that you can catch problems early, when treatment is more likely to be successful. All women should practice breast self-awareness, including women who have had breast implants. What you need:  A mirror.  A well-lit room. How to do a breast self-exam A breast self-exam is one way to learn what is normal for your breasts and whether your breasts are changing. To do a breast self-exam: Look for changes 1. Remove all the clothing above your waist. 2. Stand in front of a mirror in a room with good lighting. 3. Put your hands on your hips. 4. Push   your hands firmly downward. 5. Compare your breasts in the mirror. Look for differences between them (asymmetry), such as: ? Differences in shape. ? Differences in size. ? Puckers, dips, and bumps in one breast and not the other. 6. Look at each breast for changes in the skin, such as: ? Redness. ? Scaly areas. 7. Look for changes in your nipples, such as: ? Discharge. ? Bleeding. ? Dimpling. ? Redness. ? A change in position.   Feel for changes Carefully feel your breasts for lumps and changes. It is best to do this while lying on your back on the floor, and again while sitting or standing in the tub or shower with soapy water on your skin. Feel each breast in the following way: 1. Place the arm on the side of the breast you are examining above your head. 2. Feel your breast with the other hand. 3. Start in the nipple area and make -inch (2 cm) overlapping circles to feel your breast. Use the  pads of your three middle fingers to do this. Apply light pressure, then medium pressure, then firm pressure. The light pressure will allow you to feel the tissue closest to the skin. The medium pressure will allow you to feel the tissue that is a little deeper. The firm pressure will allow you to feel the tissue close to the ribs. 4. Continue the overlapping circles, moving downward over the breast until you feel your ribs below your breast. 5. Move one finger-width toward the center of the body. Continue to use the -inch (2 cm) overlapping circles to feel your breast as you move slowly up toward your collarbone. 6. Continue the up-and-down exam using all three pressures until you reach your armpit.   Write down what you find Writing down what you find can help you remember what to discuss with your health care provider. Write down:  What is normal for each breast.  Any changes that you find in each breast, including: ? The kind of changes you find. ? Any pain or tenderness. ? Size and location of any lumps.  Where you are in your menstrual cycle, if you are still menstruating. General tips and recommendations  Examine your breasts every month.  If you are breastfeeding, the best time to examine your breasts is after a feeding or after using a breast pump.  If you menstruate, the best time to examine your breasts is 5-7 days after your period. Breasts are generally lumpier during menstrual periods, and it may be more difficult to notice changes.  With time and practice, you will become more familiar with the variations in your breasts and more comfortable with the exam. Contact a health care provider if you:  See a change in the shape or size of your breasts or nipples.  See a change in the skin of your breast or nipples, such as a reddened or scaly area.  Have unusual discharge from your nipples.  Find a lump or thick area that was not there before.  Have pain in your  breasts.  Have any concerns related to your breast health. Summary  Breast self-awareness includes looking for physical changes in your breasts, as well as feeling for any changes within your breasts.  Breast self-awareness should be performed in front of a mirror in a well-lit room.  You should examine your breasts every month. If you menstruate, the best time to examine your breasts is 5-7 days after your menstrual period.  Let your health   Let your health care provider know of any changes you notice in your breasts, including changes in size, changes on the skin, pain or tenderness, or unusual fluid from your nipples. This information is not intended to replace advice given to you by your health care provider. Make sure you discuss any questions you have with your health care provider. Document Revised: 01/07/2018 Document Reviewed: 01/07/2018 Elsevier Patient Education  [place] Maintenance for Postmenopausal Women Menopause is a normal process in which your ability to get pregnant comes to an end. This process happens slowly over many months or years, usually between the ages of 74 and 67. Menopause is complete when you have missed your menstrual periods for 12 months. It is important to talk with your health care provider about some of the most common conditions that affect women after menopause (postmenopausal women). These include heart disease, cancer, and bone loss (osteoporosis). Adopting a healthy lifestyle and getting preventive care can help to promote your health and wellness. The actions you take can also lower your chances of developing some of these common conditions. What should I know about menopause? During menopause, you may get a number of symptoms, such as:  Hot flashes. These can be moderate or severe.  Night sweats.  Decrease in sex drive.  Mood swings.  Headaches.  Tiredness.  Irritability.  Memory problems.  Insomnia. Choosing to treat  or not to treat these symptoms is a decision that you make with your health care provider. Do I need hormone replacement therapy?  Hormone replacement therapy is effective in treating symptoms that are caused by menopause, such as hot flashes and night sweats.  Hormone replacement carries certain risks, especially as you become older. If you are thinking about using estrogen or estrogen with progestin, discuss the benefits and risks with your health care provider. What is my risk for heart disease and stroke? The risk of heart disease, heart attack, and stroke increases as you age. One of the causes may be a change in the body's hormones during menopause. This can affect how your body uses dietary fats, triglycerides, and cholesterol. Heart attack and stroke are medical emergencies. There are many things that you can do to help prevent heart disease and stroke. Watch your blood pressure  High blood pressure causes heart disease and increases the risk of stroke. This is more likely to develop in people who have high blood pressure readings, are of African descent, or are overweight.  Have your blood pressure checked: ? Every 3-5 years if you are 47-13 years of age. ? Every year if you are 45 years old or older. Eat a healthy diet  Eat a diet that includes plenty of vegetables, fruits, low-fat dairy products, and lean protein.  Do not eat a lot of foods that are high in solid fats, added sugars, or sodium.   Get regular exercise Get regular exercise. This is one of the most important things you can do for your health. Most adults should:  Try to exercise for at least 150 minutes each week. The exercise should increase your heart rate and make you sweat (moderate-intensity exercise).  Try to do strengthening exercises at least twice each week. Do these in addition to the moderate-intensity exercise.  Spend less time sitting. Even light physical activity can be beneficial. Other tips  Work  with your health care provider to achieve or maintain a healthy weight.  Do not use any  products that contain nicotine or tobacco, such as cigarettes, e-cigarettes, and chewing tobacco. If you need help quitting, ask your health care provider.  Know your numbers. Ask your health care provider to check your cholesterol and your blood sugar (glucose). Continue to have your blood tested as directed by your health care provider. Do I need screening for cancer? Depending on your health history and family history, you may need to have cancer screening at different stages of your life. This may include screening for:  Breast cancer.  Cervical cancer.  Lung cancer.  Colorectal cancer. What is my risk for osteoporosis? After menopause, you may be at increased risk for osteoporosis. Osteoporosis is a condition in which bone destruction happens more quickly than new bone creation. To help prevent osteoporosis or the bone fractures that can happen because of osteoporosis, you may take the following actions:  If you are 33-57 years old, get at least 1,000 mg of calcium and at least 600 mg of vitamin D per day.  If you are older than age 53 but younger than age 79, get at least 1,200 mg of calcium and at least 600 mg of vitamin D per day.  If you are older than age 45, get at least 1,200 mg of calcium and at least 800 mg of vitamin D per day. Smoking and drinking excessive alcohol increase the risk of osteoporosis. Eat foods that are rich in calcium and vitamin D, and do weight-bearing exercises several times each week as directed by your health care provider. How does menopause affect my mental health? Depression may occur at any age, but it is more common as you become older. Common symptoms of depression include:  Low or sad mood.  Changes in sleep patterns.  Changes in appetite or eating patterns.  Feeling an overall lack of motivation or enjoyment of activities that you previously  enjoyed.  Frequent crying spells. Talk with your health care provider if you think that you are experiencing depression. General instructions See your health care provider for regular wellness exams and vaccines. This may include:  Scheduling regular health, dental, and eye exams.  Getting and maintaining your vaccines. These include: ? Influenza vaccine. Get this vaccine each year before the flu season begins. ? Pneumonia vaccine. ? Shingles vaccine. ? Tetanus, diphtheria, and pertussis (Tdap) booster vaccine. Your health care provider may also recommend other immunizations. Tell your health care provider if you have ever been abused or do not feel safe at home. Summary  Menopause is a normal process in which your ability to get pregnant comes to an end.  This condition causes hot flashes, night sweats, decreased interest in sex, mood swings, headaches, or lack of sleep.  Treatment for this condition may include hormone replacement therapy.  Take actions to keep yourself healthy, including exercising regularly, eating a healthy diet, watching your weight, and checking your blood pressure and blood sugar levels.  Get screened for cancer and depression. Make sure that you are up to date with all your vaccines. This information is not intended to replace advice given to you by your health care provider. Make sure you discuss any questions you have with your health care provider. Document Revised: 05/14/2018 Document Reviewed: 05/14/2018 Elsevier Patient Education  [place] Maintenance for Postmenopausal Women Menopause is a normal process in which your ability to get pregnant comes to an end. This process happens slowly over many months or years, usually between  the ages of 46 and 82. Menopause is complete when you have missed your menstrual periods for 12 months. It is important to talk with your health care provider about some of the most common  conditions that affect women after menopause (postmenopausal women). These include heart disease, cancer, and bone loss (osteoporosis). Adopting a healthy lifestyle and getting preventive care can help to promote your health and wellness. The actions you take can also lower your chances of developing some of these common conditions. What should I know about menopause? During menopause, you may get a number of symptoms, such as:  Hot flashes. These can be moderate or severe.  Night sweats.  Decrease in sex drive.  Mood swings.  Headaches.  Tiredness.  Irritability.  Memory problems.  Insomnia. Choosing to treat or not to treat these symptoms is a decision that you make with your health care provider. Do I need hormone replacement therapy?  Hormone replacement therapy is effective in treating symptoms that are caused by menopause, such as hot flashes and night sweats.  Hormone replacement carries certain risks, especially as you become older. If you are thinking about using estrogen or estrogen with progestin, discuss the benefits and risks with your health care provider. What is my risk for heart disease and stroke? The risk of heart disease, heart attack, and stroke increases as you age. One of the causes may be a change in the body's hormones during menopause. This can affect how your body uses dietary fats, triglycerides, and cholesterol. Heart attack and stroke are medical emergencies. There are many things that you can do to help prevent heart disease and stroke. Watch your blood pressure  High blood pressure causes heart disease and increases the risk of stroke. This is more likely to develop in people who have high blood pressure readings, are of African descent, or are overweight.  Have your blood pressure checked: ? Every 3-5 years if you are 21-70 years of age. ? Every year if you are 22 years old or older. Eat a healthy diet  Eat a diet that includes plenty of  vegetables, fruits, low-fat dairy products, and lean protein.  Do not eat a lot of foods that are high in solid fats, added sugars, or sodium.   Get regular exercise Get regular exercise. This is one of the most important things you can do for your health. Most adults should:  Try to exercise for at least 150 minutes each week. The exercise should increase your heart rate and make you sweat (moderate-intensity exercise).  Try to do strengthening exercises at least twice each week. Do these in addition to the moderate-intensity exercise.  Spend less time sitting. Even light physical activity can be beneficial. Other tips  Work with your health care provider to achieve or maintain a healthy weight.  Do not use any products that contain nicotine or tobacco, such as cigarettes, e-cigarettes, and chewing tobacco. If you need help quitting, ask your health care provider.  Know your numbers. Ask your health care provider to check your cholesterol and your blood sugar (glucose). Continue to have your blood tested as directed by your health care provider. Do I need screening for cancer? Depending on your health history and family history, you may need to have cancer screening at different stages of your life. This may include screening for:  Breast cancer.  Cervical cancer.  Lung cancer.  Colorectal cancer. What is my risk for osteoporosis? After menopause, you may be at increased risk  for osteoporosis. Osteoporosis is a condition in which bone destruction happens more quickly than new bone creation. To help prevent osteoporosis or the bone fractures that can happen because of osteoporosis, you may take the following actions:  If you are 23-66 years old, get at least 1,000 mg of calcium and at least 600 mg of vitamin D per day.  If you are older than age 75 but younger than age 66, get at least 1,200 mg of calcium and at least 600 mg of vitamin D per day.  If you are older than age 41, get  at least 1,200 mg of calcium and at least 800 mg of vitamin D per day. Smoking and drinking excessive alcohol increase the risk of osteoporosis. Eat foods that are rich in calcium and vitamin D, and do weight-bearing exercises several times each week as directed by your health care provider. How does menopause affect my mental health? Depression may occur at any age, but it is more common as you become older. Common symptoms of depression include:  Low or sad mood.  Changes in sleep patterns.  Changes in appetite or eating patterns.  Feeling an overall lack of motivation or enjoyment of activities that you previously enjoyed.  Frequent crying spells. Talk with your health care provider if you think that you are experiencing depression. General instructions See your health care provider for regular wellness exams and vaccines. This may include:  Scheduling regular health, dental, and eye exams.  Getting and maintaining your vaccines. These include: ? Influenza vaccine. Get this vaccine each year before the flu season begins. ? Pneumonia vaccine. ? Shingles vaccine. ? Tetanus, diphtheria, and pertussis (Tdap) booster vaccine. Your health care provider may also recommend other immunizations. Tell your health care provider if you have ever been abused or do not feel safe at home. Summary  Menopause is a normal process in which your ability to get pregnant comes to an end.  This condition causes hot flashes, night sweats, decreased interest in sex, mood swings, headaches, or lack of sleep.  Treatment for this condition may include hormone replacement therapy.  Take actions to keep yourself healthy, including exercising regularly, eating a healthy diet, watching your weight, and checking your blood pressure and blood sugar levels.  Get screened for cancer and depression. Make sure that you are up to date with all your vaccines. This information is not intended to replace advice given  to you by your health care provider. Make sure you discuss any questions you have with your health care provider. Document Revised: 05/14/2018 Document Reviewed: 05/14/2018 Elsevier Patient Education  2021 [employer].

## 2020-08-03 NOTE — Progress Notes (Signed)
New Patient is in the office to establish care Last pap 09/11/2016 Last mammogram 04/07/2020 Pt is postmenopausal, reports some vaginal itching.

## 2020-08-04 LAB — CERVICOVAGINAL ANCILLARY ONLY
Bacterial Vaginitis (gardnerella): NEGATIVE
Candida Glabrata: NEGATIVE
Candida Vaginitis: NEGATIVE
Chlamydia: NEGATIVE
Comment: NEGATIVE
Comment: NEGATIVE
Comment: NEGATIVE
Comment: NEGATIVE
Comment: NEGATIVE
Comment: NORMAL
Neisseria Gonorrhea: NEGATIVE
Trichomonas: NEGATIVE

## 2020-08-04 LAB — RPR: RPR Ser Ql: NONREACTIVE

## 2020-08-04 LAB — HEPATITIS C ANTIBODY: Hep C Virus Ab: <0.1 s/co ratio (ref 0.0–0.9)

## 2020-08-04 LAB — HEPATITIS B SURFACE ANTIGEN: Hepatitis B Surface Ag: NEGATIVE

## 2020-08-04 LAB — HIV ANTIBODY (ROUTINE TESTING W REFLEX): HIV Screen 4th Generation wRfx: NONREACTIVE

## 2020-08-05 LAB — CYTOLOGY - PAP
Comment: NEGATIVE
Diagnosis: NEGATIVE
High risk HPV: NEGATIVE

## 2020-09-07 ENCOUNTER — Ambulatory Visit (INDEPENDENT_AMBULATORY_CARE_PROVIDER_SITE_OTHER): Payer: Medicaid Other | Admitting: Obstetrics and Gynecology

## 2020-09-07 ENCOUNTER — Encounter: Payer: Self-pay | Admitting: Obstetrics and Gynecology

## 2020-09-07 ENCOUNTER — Other Ambulatory Visit: Payer: Self-pay

## 2020-09-07 VITALS — BP 108/76 | HR 108 | Ht 64.0 in

## 2020-09-07 DIAGNOSIS — R3914 Feeling of incomplete bladder emptying: Secondary | ICD-10-CM | POA: Diagnosis not present

## 2020-09-07 DIAGNOSIS — K5900 Constipation, unspecified: Secondary | ICD-10-CM

## 2020-09-07 DIAGNOSIS — R3915 Urgency of urination: Secondary | ICD-10-CM | POA: Diagnosis not present

## 2020-09-07 LAB — POCT URINALYSIS DIPSTICK
Appearance: NORMAL
Bilirubin, UA: NEGATIVE
Blood, UA: NEGATIVE
Glucose, UA: NEGATIVE
Ketones, UA: NEGATIVE
Leukocytes, UA: NEGATIVE
Nitrite, UA: NEGATIVE
Protein, UA: NEGATIVE
Spec Grav, UA: 1.01 (ref 1.010–1.025)
Urobilinogen, UA: 2 E.U./dL — AB
pH, UA: 6.5 (ref 5.0–8.0)

## 2020-09-07 NOTE — Patient Instructions (Addendum)
Today we talked about ways to manage bladder urgency such as altering your diet to avoid irritative beverages and foods (bladder diet) as well as attempting to decrease stress and other exacerbating factors.    The Most Bothersome Foods* The Least Bothersome Foods*  Coffee - Regular & Decaf Tea - caffeinated Carbonated beverages - cola, non-colas, diet & caffeine-free Alcohols - Beer, Red Wine, White Wine, [address] - Grapefruit, [place], Orange, [employer] - Cranberry, Grapefruit, Orange, Pineapple Vegetables - Tomato & Tomato Products Flavor Enhancers - Hot peppers, Spicy foods, Chili, Horseradish, Vinegar, Monosodium glutamate (MSG) Artificial Sweeteners - NutraSweet, Sweet 'N Low, Equal (sweetener), Saccharin Ethnic foods - [place], [place], [place] food [employer] - low-fat & whole Fruits - Bananas, Blueberries, Honeydew melon, Pears, Raisins, Watermelon Vegetables - Broccoli, [address] Sprouts, [place], Carrots, Cauliflower, [place], Cucumber, Mushrooms, Peas, Radishes, Squash, Zucchini, White potatoes, Sweet potatoes & yams Poultry - Chicken, Eggs, [place], [employer] - Beef, Diplomatic Services operational officer, Lamb Seafood - Shrimp, [place] fish, Salmon Grains - Oat, Rice Snacks - Pretzels, Popcorn  *Lenward Chancellor et al. Diet and its role in interstitial cystitis/bladder pain syndrome (IC/BPS) and comorbid conditions. [employer]. BJU Int. 2012 Jan 11.    Constipation: Our goal is to achieve formed bowel movements daily or every-other-day.  You may need to try different combinations of the following options to find what works best for you - everybody's body works differently so feel free to adjust the dosages as needed.  Some options to help maintain bowel health include:  Marland Kitchen Dietary changes (more leafy greens, vegetables and fruits; less processed foods) . Fiber supplementation (Benefiber, FiberCon, Metamucil or Psyllium). Start slow and increase gradually to full dose. . Over-the-counter agents such  as: stool softeners (Docusate or Colace) and/or laxatives (Miralax, milk of magnesia)  . "Power Pudding" is a natural mixture that may help your constipation.  To make blend 1 cup applesauce, 1 cup wheat bran, and 3/4 cup prune juice, refrigerate and then take 1 tablespoon daily with a large glass of water as needed.  Women should try to eat at least 21 to 25 grams of fiber a day, while men should aim for 30 to 38 grams a day. You can add fiber to your diet with food or a fiber supplement such as psyllium (metamucil), benefiber, or fibercon.   Here's a look at how much dietary fiber is found in some common foods. When buying packaged foods, check the Nutrition Facts label for fiber content. It can vary among brands.  Fruits Serving size Total fiber (grams)*  Raspberries 1 cup 8.0  Pear 1 medium 5.5  Apple, with skin 1 medium 4.5  Banana 1 medium 3.0  Orange 1 medium 3.0  Strawberries 1 cup 3.0   Vegetables Serving size Total fiber (grams)*  Green peas, boiled 1 cup 9.0  Broccoli, boiled 1 cup chopped 5.0  Turnip greens, boiled 1 cup 5.0  Brussels sprouts, boiled 1 cup 4.0  Potato, with skin, baked 1 medium 4.0  Sweet corn, boiled 1 cup 3.5  Cauliflower, raw 1 cup chopped 2.0  Carrot, raw 1 medium 1.5   Grains Serving size Total fiber (grams)*  Spaghetti, whole-wheat, cooked 1 cup 6.0  Barley, pearled, cooked 1 cup 6.0  Bran flakes 3/4 cup 5.5  Quinoa, cooked 1 cup 5.0  Oat bran muffin 1 medium 5.0  Oatmeal, instant, cooked 1 cup 5.0  Popcorn, air-popped 3 cups 3.5  Brown rice, cooked 1 cup 3.5  Bread, whole-wheat 1 slice 2.0  Bread, rye 1 slice 2.0   Legumes, nuts and seeds Serving size Total fiber (grams)*  Split peas, boiled 1 cup 16.0  Lentils, boiled 1 cup 15.5  Black beans, boiled 1 cup 15.0  Baked beans, canned 1 cup 10.0  Chia seeds 1 ounce 10.0  Almonds 1 ounce (23 nuts) 3.5  Pistachios 1 ounce (49 nuts) 3.0  Sunflower kernels 1 ounce 3.0  *Rounded to nearest  0.5 gram. Source: [employer] for [employer], [employer]	[place]

## 2020-09-07 NOTE — Progress Notes (Signed)
[facility] New Patient Evaluation and Consultation  Referring Provider: Marylen Ponto, NP PCP: Barbarann Ehlers Date of Service: 09/07/2020  SUBJECTIVE Chief Complaint: New Patient (Initial Visit) - difficulty urinating  History of Present Illness: Crystal Gross is a 55 y.o. White or Caucasian female seen in consultation at the request of NP Nugent for evaluation of difficulty urinating.    Review of records significant for: Reports difficulty starting urine stream.  Urinary Symptoms: Does not leak urine.   Day time voids 7.  Nocturia: 1 times per night to void. Voiding dysfunction: she sometimes empties her bladder well. Feels that she has to push to start the urination, sometimes rushes to urinate.  does not use a catheter to empty bladder.  When urinating, she feels a weak stream and dribbling after finishing Drinks: water per day  UTIs: 0 UTI's in the last year.   Denies history of blood in urine and kidney or bladder stones  Pelvic Organ Prolapse Symptoms:                  She Denies a feeling of a bulge the vaginal area.   Bowel Symptom: Bowel movements: 1 time(s) per day Stool consistency: soft  Straining: yes, sometimes Splinting: no.  Incomplete evacuation: yes, sometimes  She Denies accidental bowel leakage / fecal incontinence Bowel regimen: none Last colonoscopy: Date 12/2019, Results- return 5 years  Sexual Function Sexually active: no.   Pelvic Pain Denies pelvic pain  Past Medical History:  Past Medical History:  Diagnosis Date  . Anxiety   . UTI (lower urinary tract infection)      Past Surgical History:   Past Surgical History:  Procedure Laterality Date  . APPENDECTOMY    . COLONOSCOPY  2021     Past OB/GYN History: G3 P3 Vaginal deliveries: 3,  Forceps/ Vacuum deliveries: 0, Cesarean section: 0 Menopausal: Yes, Denies vaginal bleeding since menopause Last pap smear was 08/2020- negative.  Any history of  abnormal pap smears: no.   Medications: She has a current medication list which includes the following prescription(s): vitamin c, biotin, hydroxypropyl methylcellulose / hypromellose, ibuprofen, multiple vitamins-minerals, zinc sulfate, methocarbamol, naproxen, naproxen, naproxen sodium, and prednisone.   Allergies: Patient has No Known Allergies.   Social History:  Social History   Tobacco Use  . Smoking status: Never Smoker  . Smokeless tobacco: Never Used  Vaping Use  . Vaping Use: Never used  Substance Use Topics  . Alcohol use: No  . Drug use: No    Relationship status: single She is not employed.   Family History:   Family History  Problem Relation Age of Onset  . Diabetes Mother   . Hypertension Mother   . Stroke Mother   . Heart failure Father   . Migraines Father      Review of Systems: Review of Systems  Constitutional: Negative for fever, malaise/fatigue and weight loss.  Respiratory: Negative for cough, shortness of breath and wheezing.   Cardiovascular: Positive for palpitations. Negative for chest pain and leg swelling.  Gastrointestinal: Negative for abdominal pain and blood in stool.  Genitourinary: Negative for dysuria.  Musculoskeletal: Negative for myalgias.  Skin: Negative for rash.  Neurological: Negative for dizziness and headaches.  Endo/Heme/Allergies: Does not bruise/bleed easily.  Psychiatric/Behavioral: Negative for depression. The patient is nervous/anxious.      OBJECTIVE Physical Exam: Vitals:   09/07/20 1410  BP: 108/76  Pulse: (!) 108  Height: 5\' 4"  (1.626 m)  Physical Exam Constitutional:      General: She is not in acute distress. Pulmonary:     Effort: Pulmonary effort is normal.  Abdominal:     General: There is no distension.     Palpations: Abdomen is soft.     Tenderness: There is no abdominal tenderness. There is no rebound.  Musculoskeletal:        General: No swelling. Normal range of motion.  Skin:     General: Skin is warm and dry.     Findings: No rash.  Neurological:     Mental Status: She is alert and oriented to person, place, and time.  Psychiatric:        Mood and Affect: Mood normal.        Behavior: Behavior normal.     GU / Detailed Urogynecologic Evaluation:  Pelvic Exam: Normal external female genitalia; Bartholin's and Skene's glands normal in appearance; urethral meatus normal in appearance, no urethral masses or discharge.   CST: negative  Speculum exam reveals normal vaginal mucosa with atrophy. Cervix normal appearance. Uterus normal single, nontender. Adnexa no mass, fullness, tenderness.     Pelvic floor strength II/V, puborectalis III/V external anal sphincter IV/V. Some difficulty fully relaxing pelvic floor with valsalva.   Pelvic floor musculature: Right levator non-tender, Right obturator non-tender, Left levator non-tender, Left obturator non-tender  POP-Q:   POP-Q  -2.5                                            Aa   -2.5                                           Ba  -6.5                                              C   3                                            Gh  3                                            Pb  9                                            tvl   -3                                            Ap  -3  Bp  -8                                              D     Rectal Exam:  Normal sphincter tone, no distal rectocele, enterocoele not present, no rectal masses, no sign of dyssynergia when asking the patient to bear down, although some difficulty fully relaxing muscles.  Post-Void Residual (PVR) by Bladder Scan: In order to evaluate bladder emptying, we discussed obtaining a postvoid residual and she agreed to this procedure.  Procedure: The ultrasound unit was placed on the patient's abdomen in the suprapubic region after the patient had voided. A PVR of 9 ml was obtained by  bladder scan.  Laboratory Results: POC urine: 2+ urobilinogen, otherwise negative I visualized the urine specimen, noting the specimen to be clear yellow  ASSESSMENT AND PLAN [name] is a 55 y.o. with:  1. Feeling of incomplete bladder emptying   2. Urinary urgency   3. Constipation, unspecified constipation type     1. Sensation of incomplete emptying/ difficulty urinating - She empties her bladder well, normal PVR.  - We discussed techniques to assist with pelvic floor relaxation when voiding. She should avoid straining. Using a stool to elevate her knees and perform deep breathing/ diaphragmatic breathing while on the toilet can help to relax the pelvic floor. Handouts provided on diaphragmatic breathing as well as pelvic floor muscle exercises and bladder retraining.  - She would like to see how she does with these exercises on her own and if is not making progress, will consider pelvic floor physical therapy.   2. Urgency - Discussed decreasing any bladder irritants. She already avoids any artificial sweeteners. List provided.   3. Constipation - For constipation, we reviewed the importance of a better bowel regimen.  We also discussed the importance of avoiding chronic straining, as it can exacerbate her pelvic floor symptoms.  - We discussed initiating therapy with increasing fluid intake, fiber supplementation, and eating a healthy diet.   Return 1 month to assess progress.    Marguerita Beards, MD   Medical Decision Making:  - Reviewed/ ordered a clinical laboratory test - Review and summation of prior records

## 2020-09-20 ENCOUNTER — Ambulatory Visit (INDEPENDENT_AMBULATORY_CARE_PROVIDER_SITE_OTHER): Payer: Medicaid Other | Admitting: Family Medicine

## 2020-09-20 ENCOUNTER — Encounter: Payer: Self-pay | Admitting: Family Medicine

## 2020-09-20 ENCOUNTER — Other Ambulatory Visit: Payer: Self-pay

## 2020-09-20 DIAGNOSIS — M7542 Impingement syndrome of left shoulder: Secondary | ICD-10-CM | POA: Diagnosis not present

## 2020-09-20 DIAGNOSIS — M222X1 Patellofemoral disorders, right knee: Secondary | ICD-10-CM | POA: Diagnosis not present

## 2020-09-20 MED ORDER — CELECOXIB 200 MG PO CAPS: ORAL_CAPSULE | ORAL | 2 refills | Status: DC

## 2020-09-20 NOTE — Progress Notes (Signed)
  Crystal Gross - 55 y.o. female MRN [id number]  Date of birth: 08/19/1965  SUBJECTIVE:  Including CC & ROS.  No chief complaint on file.   Crystal Gross is a 55 y.o. female that is presenting with acute on chronic right knee pain and left shoulder pain.  Her symptoms still are occurring after her injury over a year ago.   Review of Systems See HPI   HISTORY: Past Medical, Surgical, Social, and Family History Reviewed & Updated per EMR.   Pertinent Historical Findings include:  Past Medical History:  Diagnosis Date  . Anxiety   . UTI (lower urinary tract infection)     Past Surgical History:  Procedure Laterality Date  . APPENDECTOMY    . COLONOSCOPY  2021    Family History  Problem Relation Age of Onset  . Diabetes Mother   . Hypertension Mother   . Stroke Mother   . Heart failure Father   . Migraines Father     Social History   Socioeconomic History  . Marital status: Single    Spouse name: Not on file  . Number of children: Not on file  . Years of education: Not on file  . Highest education level: Not on file  Occupational History  . Not on file  Tobacco Use  . Smoking status: Never Smoker  . Smokeless tobacco: Never Used  Vaping Use  . Vaping Use: Never used  Substance and Sexual Activity  . Alcohol use: No  . Drug use: No  . Sexual activity: Not Currently    Birth control/protection: None  Other Topics Concern  . Not on file  Social History Narrative   ** Merged History Encounter **       Social Determinants of Health   Financial Resource Strain: Not on file  Food Insecurity: Not on file  Transportation Needs: Not on file  Physical Activity: Not on file  Stress: Not on file  Social Connections: Not on file  Intimate Partner Violence: Not on file     PHYSICAL EXAM:  VS: BP 118/72 (BP Location: Right Arm, Patient Position: Sitting, Cuff Size: Normal)   Ht 5\' 4"  (1.626 m)   Wt 167 lb (75.8 kg)   LMP 07/19/2016 (LMP Unknown)    BMI 28.67 kg/m  Physical Exam Gen: NAD, alert, cooperative with exam, well-appearing    ASSESSMENT & PLAN:   Patellofemoral pain syndrome of right knee Acute on chronic in nature.  Still having abnormal sensation. -Counseled on home exercise therapy and supportive care. -Celebrex. -Would consider shockwave therapy going forward.  Shoulder impingement syndrome, left Acute on chronic in nature.  She still appreciates a popping sensation with abduction.  We have tried steroid injection  -Counseled on home exercise therapy and supportive care. -Could consider physical therapy or PRP

## 2020-09-20 NOTE — Assessment & Plan Note (Addendum)
Acute on chronic in nature.  She still appreciates a popping sensation with abduction.  We have tried steroid injection  -Counseled on home exercise therapy and supportive care. -Could consider physical therapy or PRP

## 2020-09-20 NOTE — Assessment & Plan Note (Signed)
Acute on chronic in nature.  Still having abnormal sensation. -Counseled on home exercise therapy and supportive care. -Celebrex. -Would consider shockwave therapy going forward.

## 2020-09-20 NOTE — Patient Instructions (Signed)
Good to see you Please try the celebrex. Do not take with ibuprofen or naproxen   Please send me a message in MyChart with any questions or updates.  Please see me back in 6-8 weeks.   --Dr. Jordan Likes

## 2020-10-11 ENCOUNTER — Ambulatory Visit: Payer: Medicaid Other | Admitting: Obstetrics and Gynecology

## 2020-11-17 ENCOUNTER — Ambulatory Visit: Payer: Self-pay

## 2020-11-17 ENCOUNTER — Other Ambulatory Visit: Payer: Self-pay

## 2020-11-17 ENCOUNTER — Encounter: Payer: Self-pay | Admitting: Family Medicine

## 2020-11-17 ENCOUNTER — Ambulatory Visit (INDEPENDENT_AMBULATORY_CARE_PROVIDER_SITE_OTHER): Payer: Medicaid Other | Admitting: Family Medicine

## 2020-11-17 VITALS — BP 118/80 | Ht 64.0 in | Wt 167.0 lb

## 2020-11-17 DIAGNOSIS — M25532 Pain in left wrist: Secondary | ICD-10-CM

## 2020-11-17 DIAGNOSIS — M25832 Other specified joint disorders, left wrist: Secondary | ICD-10-CM | POA: Diagnosis present

## 2020-11-17 DIAGNOSIS — M19019 Primary osteoarthritis, unspecified shoulder: Secondary | ICD-10-CM | POA: Insufficient documentation

## 2020-11-17 DIAGNOSIS — M222X1 Patellofemoral disorders, right knee: Secondary | ICD-10-CM | POA: Diagnosis not present

## 2020-11-17 MED ORDER — MELOXICAM 7.5 MG PO TABS: 7.5000 mg | ORAL_TABLET | Freq: Two times a day (BID) | ORAL | 1 refills | Status: DC | PRN

## 2020-11-17 NOTE — Assessment & Plan Note (Signed)
Reports symptoms since her injury a year and a half ago.  Does have change of the [facility] joint which could be contributing some of her sensations. -Counseled on home exercise therapy and supportive care. -Mobic. -Could consider injection.

## 2020-11-17 NOTE — Assessment & Plan Note (Signed)
She endorses abnormal sensation around the anterior lateral aspect of the knee.  Imaging has been reassuring.  Could be more of a nerve in origin. -Counseled on home exercise therapy and supportive care. -Could consider genicular nerve block versus gabapentin.

## 2020-11-17 NOTE — Assessment & Plan Note (Signed)
Having pain over the dorsum of the wrist.  Reports pain is been ongoing for a year and a half since her injury where a shade landed on her. -Counseled on home exercise therapy and supportive care. -Counseled on the brace. -Could consider injection, physical therapy or further imaging.

## 2020-11-17 NOTE — Patient Instructions (Signed)
Good to see you Please try the mobic  Please let me know how you're doing and we could try gabapentin in the future.   Please send me a message in MyChart with any questions or updates.  Please see me back in 2-3 months.   --Dr. Jordan Likes

## 2020-11-17 NOTE — Progress Notes (Signed)
Crystal Gross - 55 y.o. female MRN [id number]  Date of birth: 06-13-1965  SUBJECTIVE:  Including CC & ROS.  No chief complaint on file.   Crystal Gross is a 55 y.o. female that is following up for her right knee and left shoulder pain.  Her left wrist has been bothering her as well.   Review of Systems See HPI   HISTORY: Past Medical, Surgical, Social, and Family History Reviewed & Updated per EMR.   Pertinent Historical Findings include:  Past Medical History:  Diagnosis Date   Anxiety    UTI (lower urinary tract infection)     Past Surgical History:  Procedure Laterality Date   APPENDECTOMY     COLONOSCOPY  2021    Family History  Problem Relation Age of Onset   Diabetes Mother    Hypertension Mother    Stroke Mother    Heart failure Father    Migraines Father     Social History   Socioeconomic History   Marital status: Single    Spouse name: Not on file   Number of children: Not on file   Years of education: Not on file   Highest education level: Not on file  Occupational History   Not on file  Tobacco Use   Smoking status: Never   Smokeless tobacco: Never  Vaping Use   Vaping Use: Never used  Substance and Sexual Activity   Alcohol use: No   Drug use: No   Sexual activity: Not Currently    Birth control/protection: None  Other Topics Concern   Not on file  Social History Narrative   ** Merged History Encounter **       Social Determinants of Health   Financial Resource Strain: Not on file  Food Insecurity: Not on file  Transportation Needs: Not on file  Physical Activity: Not on file  Stress: Not on file  Social Connections: Not on file  Intimate Partner Violence: Not on file     PHYSICAL EXAM:  VS: BP 118/80 (BP Location: Left Arm, Patient Position: Sitting, Cuff Size: Normal)   Ht 5\' 4"  (1.626 m)   Wt 167 lb (75.8 kg)   LMP 07/19/2016 (LMP Unknown)   BMI 28.67 kg/m  Physical Exam Gen: NAD, alert, cooperative with  exam, well-appearing MSK:  Left wrist: Normal range of motion. Normal strength resistance. Neurovascular intact  Limited ultrasound: Right knee, left wrist, left shoulder:  Right knee: Normal-appearing patellar tendon and insertion. No changes of the lateral tibial plateau. Normal-appearing lateral meniscus  Left wrist: Normal-appearing first dorsal compartment. No change of the [facility] joint. No effusion within the carpal joints. Normal extensor tendons. Normal-appearing scapholunate ligament.  Left shoulder: Normal-appearing biceps tendon. Mild degenerative changes of the [facility] joint with effusion.  Summary: No structural changes appreciated at the knee or the wrist with [facility] joint effusion.  Ultrasound and interpretation by [facility], MD    ASSESSMENT & PLAN:   Patellofemoral pain syndrome of right knee She endorses abnormal sensation around the anterior lateral aspect of the knee.  Imaging has been reassuring.  Could be more of a nerve in origin. -Counseled on home exercise therapy and supportive care. -Could consider genicular nerve block versus gabapentin.  Wrist impingement syndrome, left Having pain over the dorsum of the wrist.  Reports pain is been ongoing for a year and a half since her injury where a shade landed on her. -Counseled on home exercise therapy and supportive care. -Counseled on the  brace. -Could consider injection, physical therapy or further imaging.  AC joint arthropathy Reports symptoms since her injury a year and a half ago.  Does have change of the [facility] joint which could be contributing some of her sensations. -Counseled on home exercise therapy and supportive care. -Mobic. -Could consider injection.

## 2020-12-20 ENCOUNTER — Ambulatory Visit (INDEPENDENT_AMBULATORY_CARE_PROVIDER_SITE_OTHER): Payer: Medicaid Other | Admitting: Obstetrics and Gynecology

## 2020-12-20 ENCOUNTER — Encounter: Payer: Self-pay | Admitting: Obstetrics and Gynecology

## 2020-12-20 ENCOUNTER — Other Ambulatory Visit: Payer: Self-pay

## 2020-12-20 VITALS — BP 115/81 | HR 109

## 2020-12-20 DIAGNOSIS — R3915 Urgency of urination: Secondary | ICD-10-CM

## 2020-12-20 DIAGNOSIS — K5900 Constipation, unspecified: Secondary | ICD-10-CM | POA: Diagnosis not present

## 2020-12-20 DIAGNOSIS — R3914 Feeling of incomplete bladder emptying: Secondary | ICD-10-CM | POA: Diagnosis not present

## 2020-12-20 NOTE — Progress Notes (Signed)
[facility] Return Visit  SUBJECTIVE  History of Present Illness: Crystal Gross is a 55 y.o. female seen in follow-up for difficulty with urination, urgency and constipation. Plan at last visit was to work on relaxation/ breathing techniques, decrease bladder irritants and start a fiber regimen.   Has noticed some improvement in hesitancy with breathing exercises and also with the urinary urgency.  Now having bowel movements every day with fiber. Taking a gummy fiber but feels there is too much sugar.   Not interested in PT at this time.   Past Medical History: Patient  has a past medical history of Anxiety and UTI (lower urinary tract infection).   Past Surgical History: She  has a past surgical history that includes Appendectomy and Colonoscopy (2021).   Medications: She has a current medication list which includes the following prescription(s): vitamin c, biotin, celecoxib, hydroxypropyl methylcellulose / hypromellose, meloxicam, multiple vitamins-minerals, and zinc sulfate.   Allergies: Patient has No Known Allergies.   Social History: Patient  reports that she has never smoked. She has never used smokeless tobacco. She reports that she does not drink alcohol and does not use drugs.      OBJECTIVE     Physical Exam: Vitals:   12/20/20 1309  BP: 115/81  Pulse: (!) 109   Gen: No apparent distress, A&O x 3.  Detailed Urogynecologic Evaluation:  Deferred.    ASSESSMENT AND PLAN    Crystal Gross is a 55 y.o. with:  1. Feeling of incomplete bladder emptying   2. Urinary urgency   3. Constipation, unspecified constipation type    - Symptoms have improved. Handout provided with exercises for diaphragmatic breathing and core exercises. - Continue with fiber supplementation. Recommended starting benefiber daily instead of gummy fiber to reduce sugar. Also provided list of foods to increase dietary fiber.   Follow up 6 months or as needed.   Marguerita Beards, MD  Time spent: I spent 20 minutes dedicated to the care of this patient on the date of this encounter to include pre-visit review of records, face-to-face time with the patient discussing and post visit documentation.

## 2020-12-20 NOTE — Patient Instructions (Signed)
Women should try to eat at least 21 to 25 grams of fiber a day, while men should aim for 30 to 38 grams a day. You can add fiber to your diet with food or a fiber supplement such as psyllium (metamucil), benefiber, or fibercon.   Here's a look at how much dietary fiber is found in some common foods. When buying packaged foods, check the Nutrition Facts label for fiber content. It can vary among brands.  Fruits Serving size Total fiber (grams)*  Raspberries 1 cup 8.0  Pear 1 medium 5.5  Apple, with skin 1 medium 4.5  Banana 1 medium 3.0  Orange 1 medium 3.0  Strawberries 1 cup 3.0   Vegetables Serving size Total fiber (grams)*  Green peas, boiled 1 cup 9.0  Broccoli, boiled 1 cup chopped 5.0  Turnip greens, boiled 1 cup 5.0  Brussels sprouts, boiled 1 cup 4.0  Potato, with skin, baked 1 medium 4.0  Sweet corn, boiled 1 cup 3.5  Cauliflower, raw 1 cup chopped 2.0  Carrot, raw 1 medium 1.5   Grains Serving size Total fiber (grams)*  Spaghetti, whole-wheat, cooked 1 cup 6.0  Barley, pearled, cooked 1 cup 6.0  Bran flakes 3/4 cup 5.5  Quinoa, cooked 1 cup 5.0  Oat bran muffin 1 medium 5.0  Oatmeal, instant, cooked 1 cup 5.0  Popcorn, air-popped 3 cups 3.5  Brown rice, cooked 1 cup 3.5  Bread, whole-wheat 1 slice 2.0  Bread, rye 1 slice 2.0   Legumes, nuts and seeds Serving size Total fiber (grams)*  Split peas, boiled 1 cup 16.0  Lentils, boiled 1 cup 15.5  Black beans, boiled 1 cup 15.0  Baked beans, canned 1 cup 10.0  Chia seeds 1 ounce 10.0  Almonds 1 ounce (23 nuts) 3.5  Pistachios 1 ounce (49 nuts) 3.0  Sunflower kernels 1 ounce 3.0  *Rounded to nearest 0.5 gram. Source: [employer] for [employer], [employer]	[place]

## 2021-01-17 ENCOUNTER — Ambulatory Visit (INDEPENDENT_AMBULATORY_CARE_PROVIDER_SITE_OTHER): Payer: Medicaid Other | Admitting: Family Medicine

## 2021-01-17 ENCOUNTER — Encounter: Payer: Self-pay | Admitting: Family Medicine

## 2021-01-17 ENCOUNTER — Ambulatory Visit: Payer: Self-pay

## 2021-01-17 ENCOUNTER — Other Ambulatory Visit: Payer: Self-pay

## 2021-01-17 VITALS — BP 118/82 | Ht 64.0 in | Wt 167.0 lb

## 2021-01-17 DIAGNOSIS — M19019 Primary osteoarthritis, unspecified shoulder: Secondary | ICD-10-CM | POA: Diagnosis present

## 2021-01-17 DIAGNOSIS — M222X1 Patellofemoral disorders, right knee: Secondary | ICD-10-CM | POA: Diagnosis not present

## 2021-01-17 DIAGNOSIS — M25832 Other specified joint disorders, left wrist: Secondary | ICD-10-CM | POA: Diagnosis not present

## 2021-01-17 MED ORDER — PREDNISONE 5 MG PO TABS: ORAL_TABLET | ORAL | 0 refills | Status: DC
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
## 2021-01-17 NOTE — Patient Instructions (Signed)
Good to see you Please try the prednisone   Please send me a message in MyChart with any questions or updates.  Please see me back in 8 weeks.   --Dr. Jordan Likes
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
## 2021-01-18 NOTE — Assessment & Plan Note (Signed)
Has a moderate effusion on exam which could be contributing to her stiffness and symptoms. -Counseled on home exercise therapy and supportive care. -Prednisone. -Could consider injection.

## 2021-03-07 ENCOUNTER — Other Ambulatory Visit: Payer: Self-pay | Admitting: Physician Assistant

## 2021-03-07 DIAGNOSIS — Z1231 Encounter for screening mammogram for malignant neoplasm of breast: Secondary | ICD-10-CM

## 2021-03-20 ENCOUNTER — Encounter: Payer: Self-pay | Admitting: Family Medicine

## 2021-03-20 ENCOUNTER — Ambulatory Visit (INDEPENDENT_AMBULATORY_CARE_PROVIDER_SITE_OTHER): Payer: Medicaid Other | Admitting: Family Medicine

## 2021-03-20 DIAGNOSIS — M222X1 Patellofemoral disorders, right knee: Secondary | ICD-10-CM | POA: Diagnosis not present

## 2021-03-20 DIAGNOSIS — M7542 Impingement syndrome of left shoulder: Secondary | ICD-10-CM

## 2021-03-20 MED ORDER — DICLOFENAC SODIUM 75 MG PO TBEC: 75.0000 mg | DELAYED_RELEASE_TABLET | Freq: Two times a day (BID) | ORAL | 1 refills | Status: DC

## 2021-03-20 NOTE — Patient Instructions (Signed)
Good to see you Please try the diclofenac  Please try a recumbent bike and work on strengthening   Please send me a message in MyChart with any questions or updates.  Please see me back in 8 weeks.   --Dr. Jordan Likes

## 2021-03-20 NOTE — Assessment & Plan Note (Signed)
Continues to have pain with walking. -Counseled on home exercise therapy and supportive care. -Could consider nerve block.

## 2021-03-20 NOTE — Assessment & Plan Note (Signed)
Acute on chronic in nature.  Has good range of motion on exam today. -Counseled on home exercise therapy and supportive care. -Diclofenac. -Consider injection.

## 2021-03-20 NOTE — Progress Notes (Signed)
  Crystal Gross - 55 y.o. female MRN [id number]  Date of birth: [birth year]  SUBJECTIVE:  Including CC & ROS.  No chief complaint on file.   Crystal Gross is a 55 y.o. female that is following up for her left shoulder and wrist and right knee pain.  She does feels pain in the posterior aspect of the shoulder.  Having some pain with walking in the right knee.    Review of Systems See HPI   HISTORY: Past Medical, Surgical, Social, and Family History Reviewed & Updated per EMR.   Pertinent Historical Findings include:  Past Medical History:  Diagnosis Date   Anxiety    UTI (lower urinary tract infection)     Past Surgical History:  Procedure Laterality Date   APPENDECTOMY     COLONOSCOPY  2021    Family History  Problem Relation Age of Onset   Diabetes Mother    Hypertension Mother    Stroke Mother    Heart failure Father    Migraines Father     Social History   Socioeconomic History   Marital status: Single    Spouse name: Not on file   Number of children: Not on file   Years of education: Not on file   Highest education level: Not on file  Occupational History   Not on file  Tobacco Use   Smoking status: Never   Smokeless tobacco: Never  Vaping Use   Vaping Use: Never used  Substance and Sexual Activity   Alcohol use: No   Drug use: No   Sexual activity: Not Currently    Birth control/protection: None  Other Topics Concern   Not on file  Social History Narrative   ** Merged History Encounter **       Social Determinants of Health   Financial Resource Strain: Not on file  Food Insecurity: Not on file  Transportation Needs: Not on file  Physical Activity: Not on file  Stress: Not on file  Social Connections: Not on file  Intimate Partner Violence: Not on file     PHYSICAL EXAM:  VS: Ht 5\' 4"  (1.626 m)   Wt 167 lb (75.8 kg)   LMP 07/19/2016 (LMP Unknown)   BMI 28.67 kg/m  Physical Exam Gen: NAD, alert, cooperative with exam,  well-appearing      ASSESSMENT & PLAN:   Patellofemoral pain syndrome of right knee Continues to have pain with walking. -Counseled on home exercise therapy and supportive care. -Could consider nerve block.  Shoulder impingement syndrome, left Acute on chronic in nature.  Has good range of motion on exam today. -Counseled on home exercise therapy and supportive care. -Diclofenac. -Consider injection.

## 2021-03-29 ENCOUNTER — Telehealth: Payer: Self-pay | Admitting: Family Medicine

## 2021-03-29 MED ORDER — MELOXICAM 7.5 MG PO TABS
7.5000 mg | ORAL_TABLET | Freq: Two times a day (BID) | ORAL | 1 refills | Status: DC | PRN
Start: 2021-03-29 — End: 2021-08-07

## 2021-03-29 NOTE — Telephone Encounter (Signed)
Pt called states Medicaid denied the Rx :  diclofenac (VOLTAREN) 75 MG EC tablet [[id number]]    Order Details Dose: 75 mg Route: Oral Frequency: 2 times daily  Dispense Quantity: 60 tablet Refills: 1        Sig: Take 1 tablet (75 mg total) by mouth 2 (two) times daily.    ---Patient ask if doctor can prescribe her a different one the Ins Co will cover.  --glh

## 2021-03-29 NOTE — Telephone Encounter (Signed)
New rx for meloxicam sent per Dr. Jordan Likes. See meds. Pt informed.

## 2021-04-10 ENCOUNTER — Ambulatory Visit
Admission: RE | Admit: 2021-04-10 | Discharge: 2021-04-10 | Disposition: A | Discharge status: Home / Self Care | Payer: Medicaid Other | Source: Ambulatory Visit | Attending: Physician Assistant | Admitting: Physician Assistant

## 2021-04-10 ENCOUNTER — Other Ambulatory Visit: Payer: Self-pay

## 2021-04-10 DIAGNOSIS — Z1231 Encounter for screening mammogram for malignant neoplasm of breast: Secondary | ICD-10-CM

## 2021-06-01 ENCOUNTER — Ambulatory Visit (INDEPENDENT_AMBULATORY_CARE_PROVIDER_SITE_OTHER): Payer: Medicaid Other | Admitting: Family Medicine

## 2021-06-01 ENCOUNTER — Encounter: Payer: Self-pay | Admitting: Family Medicine

## 2021-06-01 VITALS — BP 120/60 | Ht 64.0 in | Wt 167.0 lb

## 2021-06-01 DIAGNOSIS — M222X1 Patellofemoral disorders, right knee: Secondary | ICD-10-CM

## 2021-06-01 DIAGNOSIS — M7542 Impingement syndrome of left shoulder: Secondary | ICD-10-CM

## 2021-06-01 MED ORDER — PREDNISONE 5 MG PO TABS: ORAL_TABLET | ORAL | 0 refills | Status: DC

## 2021-06-01 NOTE — Progress Notes (Signed)
°  SIGNA CHEEK - 55 y.o. female MRN [id number]  Date of birth: 1965/08/22  SUBJECTIVE:  Including CC & ROS.  No chief complaint on file.   Crystal Gross is a 55 y.o. female that is presenting with acute on chronic left shoulder and right knee pain.  The pain is been ongoing for a while now.  Usually worse with certain activities.  Localized to these areas.   Review of Systems See HPI   HISTORY: Past Medical, Surgical, Social, and Family History Reviewed & Updated per EMR.   Pertinent Historical Findings include:  Past Medical History:  Diagnosis Date   Anxiety    UTI (lower urinary tract infection)     Past Surgical History:  Procedure Laterality Date   APPENDECTOMY     COLONOSCOPY  2021    Family History  Problem Relation Age of Onset   Diabetes Mother    Hypertension Mother    Stroke Mother    Heart failure Father    Migraines Father     Social History   Socioeconomic History   Marital status: Single    Spouse name: Not on file   Number of children: Not on file   Years of education: Not on file   Highest education level: Not on file  Occupational History   Not on file  Tobacco Use   Smoking status: Never   Smokeless tobacco: Never  Vaping Use   Vaping Use: Never used  Substance and Sexual Activity   Alcohol use: No   Drug use: No   Sexual activity: Not Currently    Birth control/protection: None  Other Topics Concern   Not on file  Social History Narrative   ** Merged History Encounter **       Social Determinants of Health   Financial Resource Strain: Not on file  Food Insecurity: Not on file  Transportation Needs: Not on file  Physical Activity: Not on file  Stress: Not on file  Social Connections: Not on file  Intimate Partner Violence: Not on file     PHYSICAL EXAM:  VS: BP 120/60 (BP Location: Left Arm, Patient Position: Sitting)    Ht 5\' 4"  (1.626 m)    Wt 167 lb (75.8 kg)    LMP 07/19/2016 (LMP Unknown)    BMI 28.67  kg/m  Physical Exam Gen: NAD, alert, cooperative with exam, well-appearing       ASSESSMENT & PLAN:   Patellofemoral pain syndrome of right knee Acute on chronic in nature. -Counseled on home exercise therapy and supportive care. -Could consider physical therapy. -Can pursue shockwave therapy.  Shoulder impingement syndrome, left Acute on chronic in nature.  Worse with certain movements. -Counseled on home exercise therapy and supportive care. -Prednisone. -Could consider physical therapy

## 2021-06-01 NOTE — Assessment & Plan Note (Signed)
Acute on chronic in nature. -Counseled on home exercise therapy and supportive care. -Could consider physical therapy. -Can pursue shockwave therapy.

## 2021-06-01 NOTE — Patient Instructions (Signed)
Good to see you Please try heat  Please try the medicine   Please send me a message in MyChart with any questions or updates.  Please see me back in 8 weeks.   --Dr. Jordan Likes

## 2021-06-01 NOTE — Assessment & Plan Note (Signed)
Acute on chronic in nature.  Worse with certain movements. -Counseled on home exercise therapy and supportive care. -Prednisone. -Could consider physical therapy

## 2021-07-19 ENCOUNTER — Other Ambulatory Visit: Payer: Self-pay

## 2021-07-19 ENCOUNTER — Encounter: Payer: Self-pay | Admitting: Obstetrics and Gynecology

## 2021-07-19 ENCOUNTER — Ambulatory Visit: Payer: Medicaid Other | Admitting: Obstetrics and Gynecology

## 2021-07-19 VITALS — BP 123/79 | HR 81

## 2021-07-19 DIAGNOSIS — R3915 Urgency of urination: Secondary | ICD-10-CM

## 2021-07-19 DIAGNOSIS — R3914 Feeling of incomplete bladder emptying: Secondary | ICD-10-CM | POA: Diagnosis not present

## 2021-07-19 DIAGNOSIS — K5900 Constipation, unspecified: Secondary | ICD-10-CM

## 2021-07-19 NOTE — Progress Notes (Signed)
[facility] Return Visit  SUBJECTIVE  History of Present Illness: Crystal Gross is a 56 y.o. female seen in follow-up for difficulty with urination, urgency and constipation.  Urgency has improved and feels that she is emptying her bladder well. Stopped drinking sugar free drinks and drinking mostly water and has noticed an improved.    Eating more fruits and vegetables over all. Adding fiber packets to the water (benefiber). Having bowel movements at least every other day, sometimes more often.    Past Medical History: Patient  has a past medical history of Anxiety and UTI (lower urinary tract infection).   Past Surgical History: She  has a past surgical history that includes Appendectomy and Colonoscopy (2021).   Medications: She has a current medication list which includes the following prescription(s): vitamin c, biotin, hydroxypropyl methylcellulose / hypromellose, meloxicam, multiple vitamins-minerals, prednisone, and zinc sulfate.   Allergies: Patient has No Known Allergies.   Social History: Patient  reports that she has never smoked. She has never used smokeless tobacco. She reports that she does not drink alcohol and does not use drugs.      OBJECTIVE     Physical Exam: Vitals:   07/19/21 1305  BP: 123/79  Pulse: 81    Gen: No apparent distress, A&O x 3.  Detailed Urogynecologic Evaluation:  Deferred.    ASSESSMENT AND PLAN    Crystal Gross is a 56 y.o. with:  1. Feeling of incomplete bladder emptying   2. Urinary urgency   3. Constipation, unspecified constipation type     - good improvement of symptoms.  - Continue with fiber supplementation - Continue pelvic floor exercises  Return as needed  Marguerita Beards, MD  Time spent: I spent 15 minutes dedicated to the care of this patient on the date of this encounter to include pre-visit review of records, face-to-face time with the patient discussing and post visit  documentation.

## 2021-08-03 ENCOUNTER — Ambulatory Visit: Payer: Medicaid Other | Admitting: Family Medicine

## 2021-08-07 ENCOUNTER — Ambulatory Visit (INDEPENDENT_AMBULATORY_CARE_PROVIDER_SITE_OTHER): Payer: Medicaid Other | Admitting: Family Medicine

## 2021-08-07 ENCOUNTER — Ambulatory Visit: Payer: Self-pay

## 2021-08-07 VITALS — BP 120/72 | Ht 63.0 in | Wt 165.0 lb

## 2021-08-07 DIAGNOSIS — M25521 Pain in right elbow: Secondary | ICD-10-CM

## 2021-08-07 DIAGNOSIS — M7711 Lateral epicondylitis, right elbow: Secondary | ICD-10-CM | POA: Diagnosis present

## 2021-08-07 DIAGNOSIS — M222X1 Patellofemoral disorders, right knee: Secondary | ICD-10-CM | POA: Diagnosis not present

## 2021-08-07 MED ORDER — MELOXICAM 7.5 MG PO TABS: 7.5000 mg | ORAL_TABLET | Freq: Two times a day (BID) | ORAL | 1 refills | Status: DC | PRN

## 2021-08-07 NOTE — Patient Instructions (Signed)
Good to see you ?Please use the mobic as needed  ?Please follow up if you want to start shockwave therapy   ?Please send me a message in MyChart with any questions or updates.  ?Please see me back in 8 weeks.  ? ?--Dr. Jordan Likes ? ?

## 2021-08-07 NOTE — Assessment & Plan Note (Signed)
Acutely occurring.  Having pain consistent with tennis elbow. ?-Counseled on home exercise therapy and supportive care. ?-Refilled meloxicam. ?-Could consider injection or shockwave therapy. ? ?

## 2021-08-07 NOTE — Progress Notes (Signed)
?  Crystal Gross - 56 y.o. female MRN [id number]  Date of birth: 1966-05-09 ? ?SUBJECTIVE:  Including CC & ROS.  ?No chief complaint on file. ? ? ?Crystal Gross is a 56 y.o. female that is presenting with acute right elbow pain.  Noticed the pain at the lateral elbow.  No swelling.  Pain is worse with repetitive activities. ? ? ?Review of Systems ?See HPI  ? ?HISTORY: Past Medical, Surgical, Social, and Family History Reviewed & Updated per EMR.   ?Pertinent Historical Findings include: ? ?Past Medical History:  ?Diagnosis Date  ? Anxiety   ? UTI (lower urinary tract infection)   ? ? ?Past Surgical History:  ?Procedure Laterality Date  ? APPENDECTOMY    ? COLONOSCOPY  2021  ? ? ? ?PHYSICAL EXAM:  ?VS: BP 120/72   Ht 5\' 3"  (1.6 m)   Wt 165 lb (74.8 kg)   LMP 07/19/2016 (LMP Unknown)   BMI 29.23 kg/m?  ?Physical Exam ?Gen: NAD, alert, cooperative with exam, well-appearing ?MSK:  ?Neurovascularly intact   ? ?Limited ultrasound: Right elbow: ? ?No joint effusion. ?Mild mucoid changes at the origin of the common extensors. ?Normal-appearing radial nerve at the antecubital fossa ? ?Summary: Findings consistent with lateral epicondylitis ? ?Ultrasound and interpretation by 07/21/2016, MD ? ? ? ?ASSESSMENT & PLAN:  ? ?Patellofemoral pain syndrome of right knee ?Acute on chronic in nature.  Still having sensational changes over the anterior aspect. ?-Counseled on home exercise therapy and supportive care. ?-Could pursue shockwave therapy. ? ?Lateral epicondylitis, right elbow ?Acutely occurring.  Having pain consistent with tennis elbow. ?-Counseled on home exercise therapy and supportive care. ?-Refilled meloxicam. ?-Could consider injection or shockwave therapy. ? ? ? ? ?

## 2021-08-07 NOTE — Assessment & Plan Note (Signed)
Acute on chronic in nature.  Still having sensational changes over the anterior aspect. ?-Counseled on home exercise therapy and supportive care. ?-Could pursue shockwave therapy. ?

## 2021-10-04 ENCOUNTER — Ambulatory Visit (INDEPENDENT_AMBULATORY_CARE_PROVIDER_SITE_OTHER): Payer: Medicaid Other | Admitting: Family Medicine

## 2021-10-04 DIAGNOSIS — M7711 Lateral epicondylitis, right elbow: Secondary | ICD-10-CM | POA: Diagnosis present

## 2021-10-04 DIAGNOSIS — M222X1 Patellofemoral disorders, right knee: Secondary | ICD-10-CM | POA: Diagnosis not present

## 2021-10-04 NOTE — Assessment & Plan Note (Signed)
Acute on chronic in nature.  Still having altered sensation of the anterior aspect of the knee. ?-Can pursue shockwave therapy. ?-Could consider nerve block. ?

## 2021-10-04 NOTE — Assessment & Plan Note (Signed)
Acute on chronic in nature.  Pain most consistent with tennis elbow. ?-Counseled on home exercise therapy and supportive care. ?-Pursue shockwave therapy.. ? ?

## 2021-10-04 NOTE — Progress Notes (Signed)
?  Crystal Gross - 56 y.o. female MRN [id number]  Date of birth: 04/22/1966 ? ?SUBJECTIVE:  Including CC & ROS.  ?No chief complaint on file. ? ? ?Crystal Gross is a 57 y.o. female that is following up for her right elbow pain and right knee pain. ? ? ?Review of Systems ?See HPI  ? ?HISTORY: Past Medical, Surgical, Social, and Family History Reviewed & Updated per EMR.   ?Pertinent Historical Findings include: ? ?Past Medical History:  ?Diagnosis Date  ? Anxiety   ? UTI (lower urinary tract infection)   ? ? ?Past Surgical History:  ?Procedure Laterality Date  ? APPENDECTOMY    ? COLONOSCOPY  2021  ? ? ? ?PHYSICAL EXAM:  ?VS: Ht 5\' 3"  (1.6 m)   LMP 07/19/2016 (LMP Unknown)   BMI 29.23 kg/m?  ?Physical Exam ?Gen: NAD, alert, cooperative with exam, well-appearing ?MSK:  ?Neurovascularly intact   ? ?ECSWT Note ?[name] ?[birth year] ? ?Procedure: ECSWT ?Indications: right elbow pain  ? ?Procedure Details ?Consent: Risks of procedure as well as the alternatives and risks of each were explained to the (patient/caregiver).  Consent for procedure obtained. ?Time Out: Verified patient identification, verified procedure, site/side was marked, verified correct patient position, special equipment/implants available, medications/allergies/relevent history reviewed, required imaging and test results available.  Performed.  The area was cleaned with iodine and alcohol swabs.   ? ?The right lateral epicondylar was targeted for Extracorporeal shockwave therapy.  ? ?Preset: Lateral epicondylitis ?Power Level: 30 ?Frequency: 10 ?Impulse/cycles: 2200 ?Head size: Medium ?Session: 1 ? ?Patient did tolerate procedure well. ? ? ? ?ASSESSMENT & PLAN:  ? ?Lateral epicondylitis, right elbow ?Acute on chronic in nature.  Pain most consistent with tennis elbow. ?-Counseled on home exercise therapy and supportive care. ?-Pursue shockwave therapy.. ? ? ?Patellofemoral pain syndrome of right knee ?Acute on chronic in nature.   Still having altered sensation of the anterior aspect of the knee. ?-Can pursue shockwave therapy. ?-Could consider nerve block. ? ? ? ? ?

## 2021-10-12 ENCOUNTER — Encounter: Payer: Self-pay | Admitting: Family Medicine

## 2021-10-12 ENCOUNTER — Ambulatory Visit (INDEPENDENT_AMBULATORY_CARE_PROVIDER_SITE_OTHER): Payer: Self-pay | Admitting: Family Medicine

## 2021-10-12 DIAGNOSIS — M7711 Lateral epicondylitis, right elbow: Secondary | ICD-10-CM

## 2021-10-12 NOTE — Progress Notes (Signed)
?  Crystal Gross - 56 y.o. female MRN [id number]  Date of birth: [birth year] ? ?SUBJECTIVE:  Including CC & ROS.  ?No chief complaint on file. ? ? ?Crystal Gross is a 56 y.o. female that is here for shockwave therapy. ? ? ?Review of Systems ?See HPI  ? ?HISTORY: Past Medical, Surgical, Social, and Family History Reviewed & Updated per EMR.   ?Pertinent Historical Findings include: ? ?Past Medical History:  ?Diagnosis Date  ? Anxiety   ? UTI (lower urinary tract infection)   ? ? ?Past Surgical History:  ?Procedure Laterality Date  ? APPENDECTOMY    ? COLONOSCOPY  2021  ? ? ? ?PHYSICAL EXAM:  ?VS: BP 106/78 (BP Location: Left Arm, Patient Position: Sitting)   Ht 5\' 3"  (1.6 m)   Wt 165 lb (74.8 kg)   LMP 07/19/2016 (LMP Unknown)   BMI 29.23 kg/m?  ?Physical Exam ?Gen: NAD, alert, cooperative with exam, well-appearing ?MSK:  ?Neurovascularly intact   ? ?ECSWT Note ?Crystal Gross ?05/04/1966 ? ?Procedure: ECSWT ?Indications: right elbow pain  ? ?Procedure Details ?Consent: Risks of procedure as well as the alternatives and risks of each were explained to the (patient/caregiver).  Consent for procedure obtained. ?Time Out: Verified patient identification, verified procedure, site/side was marked, verified correct patient position, special equipment/implants available, medications/allergies/relevent history reviewed, required imaging and test results available.  Performed.  The area was cleaned with iodine and alcohol swabs.   ? ?The right lateral epicondyle was targeted for Extracorporeal shockwave therapy.  ? ?Preset: Lateral epicondylitis ?Power Level: 30 ?Frequency: 10 ?Impulse/cycles: 2300 ?Head size: Medium ?Session: 2 ? ?Patient did tolerate procedure well. ? ? ? ?ASSESSMENT & PLAN:  ? ?Lateral epicondylitis, right elbow ?Completed shockwave therapy ? ? ? ? ?

## 2021-10-12 NOTE — Assessment & Plan Note (Signed)
Completed shockwave therapy  

## 2021-11-07 ENCOUNTER — Ambulatory Visit: Payer: Medicaid Other | Admitting: Family Medicine

## 2021-11-07 ENCOUNTER — Ambulatory Visit: Payer: Self-pay

## 2021-11-07 ENCOUNTER — Encounter: Payer: Self-pay | Admitting: Family Medicine

## 2021-11-07 VITALS — BP 102/70 | Ht 63.0 in | Wt 165.0 lb

## 2021-11-07 DIAGNOSIS — M7711 Lateral epicondylitis, right elbow: Secondary | ICD-10-CM | POA: Diagnosis not present

## 2021-11-07 MED ORDER — MELOXICAM 15 MG PO TABS: ORAL_TABLET | ORAL | 3 refills | Status: DC

## 2021-11-07 NOTE — Patient Instructions (Signed)
Good to see you I have made a referral to physical therapy  Please use the medicine as needed   Please send me a message in MyChart with any questions or updates.  Please see me back in 6-8 weeks.   --Dr. Raeford Razor
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
## 2021-11-23 ENCOUNTER — Telehealth: Payer: Self-pay | Admitting: *Deleted

## 2021-11-23 NOTE — Telephone Encounter (Signed)
-----   Message from Carl Best sent at 11/23/2021 10:49 AM EDT ----- Regarding: [facility] doesn't take medicaid referred pt to [facility]   Huntley Dec called from [facility] states ([facility]) faxed over referral for pt's Elbow therapy stating they DO NOT take Medicaid patients an sent referral to them instead- Huntley Dec states they need a paper referral faxed to [phone] any questions call her at : [phone]

## 2021-11-23 NOTE — Addendum Note (Signed)
Addended by: Merrilyn Puma on: 11/23/2021 03:49 PM   Modules accepted: Orders

## 2021-11-23 NOTE — Telephone Encounter (Signed)
New referral placed and faxed to number below

## 2022-02-26 ENCOUNTER — Ambulatory Visit (INDEPENDENT_AMBULATORY_CARE_PROVIDER_SITE_OTHER): Payer: Medicaid Other | Admitting: Family Medicine

## 2022-02-26 ENCOUNTER — Encounter: Payer: Self-pay | Admitting: Family Medicine

## 2022-02-26 VITALS — BP 117/76 | Ht 63.0 in | Wt 165.0 lb

## 2022-02-26 DIAGNOSIS — M7711 Lateral epicondylitis, right elbow: Secondary | ICD-10-CM

## 2022-02-26 DIAGNOSIS — M222X1 Patellofemoral disorders, right knee: Secondary | ICD-10-CM

## 2022-02-26 MED ORDER — DICLOFENAC SODIUM 75 MG PO TBEC: 75.0000 mg | DELAYED_RELEASE_TABLET | Freq: Two times a day (BID) | ORAL | 3 refills | Status: DC | PRN

## 2022-02-26 NOTE — Progress Notes (Signed)
  Crystal Gross - 56 y.o. female MRN [id number]  Date of birth: 03-27-1966  SUBJECTIVE:  Including CC & ROS.  No chief complaint on file.   Crystal Gross is a 56 y.o. female that is following up for her right elbow and right knee pain.  She was recently able to see physical therapy for her right elbow.  Has been to 1 treatment.  Continues to have altered sensation in the front of the right knee.   Review of Systems See HPI   HISTORY: Past Medical, Surgical, Social, and Family History Reviewed & Updated per EMR.   Pertinent Historical Findings include:  Past Medical History:  Diagnosis Date   Anxiety    UTI (lower urinary tract infection)     Past Surgical History:  Procedure Laterality Date   APPENDECTOMY     COLONOSCOPY  2021     PHYSICAL EXAM:  VS: BP 117/76 (BP Location: Left Arm, Patient Position: Sitting)   Ht 5\' 3"  (1.6 m)   Wt 165 lb (74.8 kg)   LMP 09/03/2014   BMI 29.23 kg/m  Physical Exam Gen: NAD, alert, cooperative with exam, well-appearing MSK:  Neurovascularly intact       ASSESSMENT & PLAN:   Patellofemoral pain syndrome of right knee Acute on chronic in nature.  Continues to have altered sensation in the anterior aspect of the knee.  Previous MRI has been unrevealing. -Counseled on home exercise therapy and supportive care. -Referral to physical therapy. -Consider shockwave therapy, nerve block, or injection  Lateral epicondylitis, right elbow Acute on chronic in nature.  Did experience some improvement with physical therapy. -Counseled on home exercise therapy and supportive care. -Continue physical therapy. -Could consider injection or further imaging.

## 2022-02-26 NOTE — Assessment & Plan Note (Signed)
Acute on chronic in nature.  Did experience some improvement with physical therapy. -Counseled on home exercise therapy and supportive care. -Continue physical therapy. -Could consider injection or further imaging.

## 2022-02-26 NOTE — Assessment & Plan Note (Signed)
Acute on chronic in nature.  Continues to have altered sensation in the anterior aspect of the knee.  Previous MRI has been unrevealing. -Counseled on home exercise therapy and supportive care. -Referral to physical therapy. -Consider shockwave therapy, nerve block, or injection

## 2022-02-26 NOTE — Patient Instructions (Signed)
Good to see you Please stop the mobic and try diclofenac  I have made a referral to physical therapy for your knee  Please send me a message in MyChart with any questions or updates.  Please see me back in 6-8 weeks.   --Dr. Raeford Razor

## 2022-03-14 ENCOUNTER — Other Ambulatory Visit: Payer: Self-pay | Admitting: Physician Assistant

## 2022-03-14 DIAGNOSIS — Z1231 Encounter for screening mammogram for malignant neoplasm of breast: Secondary | ICD-10-CM

## 2022-04-23 ENCOUNTER — Ambulatory Visit: Payer: Medicaid Other

## 2022-04-23 ENCOUNTER — Ambulatory Visit
Admission: RE | Admit: 2022-04-23 | Discharge: 2022-04-23 | Disposition: A | Discharge status: Home / Self Care | Payer: Medicaid Other | Source: Ambulatory Visit | Attending: Physician Assistant | Admitting: Physician Assistant

## 2022-04-23 DIAGNOSIS — Z1231 Encounter for screening mammogram for malignant neoplasm of breast: Secondary | ICD-10-CM

## 2022-05-21 ENCOUNTER — Ambulatory Visit: Payer: Medicaid Other | Admitting: Family Medicine

## 2022-05-21 VITALS — BP 116/70 | Ht 63.0 in | Wt 151.0 lb

## 2022-05-21 DIAGNOSIS — M222X1 Patellofemoral disorders, right knee: Secondary | ICD-10-CM | POA: Diagnosis not present

## 2022-05-21 DIAGNOSIS — M7542 Impingement syndrome of left shoulder: Secondary | ICD-10-CM | POA: Diagnosis not present

## 2022-05-21 DIAGNOSIS — M7711 Lateral epicondylitis, right elbow: Secondary | ICD-10-CM

## 2022-05-21 MED ORDER — DICLOFENAC SODIUM 75 MG PO TBEC: 75.0000 mg | DELAYED_RELEASE_TABLET | Freq: Two times a day (BID) | ORAL | 3 refills | Status: DC | PRN
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
## 2022-05-21 NOTE — Assessment & Plan Note (Signed)
Acute on chronic in nature.  Ongoing since her initial injury.  Limited success with physical therapy. -Counseled on home exercise therapy and supportive care. -Could consider further imaging or nerve study.

## 2022-05-21 NOTE — Patient Instructions (Signed)
Good to see you Please try heat as needed  Please continue with the exercises  Please let me know about the nerve study/EMG  Please send me a message in MyChart with any questions or updates.  Please see me back in 6-8 weeks.   --Dr. Jordan Likes

## 2022-05-21 NOTE — Assessment & Plan Note (Signed)
Acute on chronic in nature.  Ongoing since her initial injury. -Counseled on home exercise therapy and supportive care. -Could consider nerve study or a nerve block.
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
## 2022-05-24 ENCOUNTER — Telehealth: Payer: Self-pay | Admitting: *Deleted

## 2022-05-24 MED ORDER — MELOXICAM 15 MG PO TABS: 15.0000 mg | ORAL_TABLET | Freq: Every day | ORAL | 1 refills | Status: DC

## 2022-05-24 NOTE — Telephone Encounter (Signed)
Received fax stating Voltaren 75 mg is not covered. Plan prefers Naproxen, Ibuprofen, Meloxicam. New rx for Meloxicam sent. See meds.

## 2022-07-23 ENCOUNTER — Encounter: Payer: Self-pay | Admitting: Obstetrics and Gynecology

## 2022-07-23 ENCOUNTER — Ambulatory Visit: Payer: Medicaid Other | Admitting: Obstetrics and Gynecology

## 2022-07-23 VITALS — BP 126/82 | HR 98 | Ht 63.0 in | Wt 154.0 lb

## 2022-07-23 DIAGNOSIS — N952 Postmenopausal atrophic vaginitis: Secondary | ICD-10-CM | POA: Diagnosis not present

## 2022-07-23 DIAGNOSIS — N649 Disorder of breast, unspecified: Secondary | ICD-10-CM | POA: Diagnosis not present

## 2022-07-23 DIAGNOSIS — N951 Menopausal and female climacteric states: Secondary | ICD-10-CM

## 2022-07-23 NOTE — Progress Notes (Signed)
Concern for vaginal dryness, sometimes itchy Concern with left breast/ nipple. Had a procedure at PCP where PA attempted to remove a "tag" on her nipple. Pt reports area on nipple had a "split in it" from breastfeeding years ago. Pt reports bleeding at time of procedure. Silver nitrate used for bleeding. Reports remaining portion of "tag" peeled off.

## 2022-07-23 NOTE — Progress Notes (Signed)
  CC: breast lesion, vaginal dryness. Subjective:    Patient ID: Crystal Gross, female    DOB: 1965/11/15, 57 y.o.   MRN: [id number]  HPI 57 yo G3P3 seen for discussion of nipple lesion that was removed by her PCP.  She was concerned because the "skin tag" was removed without anesthesia and the base was cauterized with silver nitrate.  She also is currently experiencing vaginal dryness.  The patient transitioned into menopause in 2016.  She has never tried any HRT or topical estrogen.     Review of Systems     Objective:   Physical Exam Vitals:   07/23/22 1457  BP: 126/82  Pulse: 98   Breast: left nipple appears well healed  from earlier procedure.  No remnant of skin tag noted.  SSE: the vagina appears atrophic, but otherwise healthy. Left labial varicosity noted.      Assessment & Plan:   1. Vaginal dryness, menopausal Pt offered vaginal estrogen for symptom relief.  Pt wishes to wait at this time, but if discomfort worsens she is willing to consider the treatment.  2. Vaginal atrophy   3. Lesion of left nipple Lesion appears well healed, no further treatment from gyn standpoint.   F/u in 1 year or PRN She will need pap smear at next visit Griffin Basil, MD Faculty Attending, [facility]

## 2022-08-13 ENCOUNTER — Ambulatory Visit: Payer: Medicaid Other | Admitting: Family Medicine

## 2022-09-17 ENCOUNTER — Encounter: Payer: Self-pay | Admitting: *Deleted

## 2022-10-03 ENCOUNTER — Encounter: Payer: Self-pay | Admitting: Obstetrics and Gynecology

## 2022-10-03 ENCOUNTER — Ambulatory Visit: Payer: Medicaid Other | Admitting: Obstetrics and Gynecology

## 2022-10-03 VITALS — BP 130/85 | HR 105 | Ht 63.0 in | Wt 161.0 lb

## 2022-10-03 DIAGNOSIS — I863 Vulval varices: Secondary | ICD-10-CM | POA: Insufficient documentation

## 2022-10-03 DIAGNOSIS — R300 Dysuria: Secondary | ICD-10-CM

## 2022-10-03 NOTE — Progress Notes (Addendum)
57 y.o. GYN presents for CT Imaging results.  Pt is nervous about the results.  C/o dysuria

## 2022-10-03 NOTE — Progress Notes (Signed)
Ms Dominski presents for exam following findings on recent CT scan. CT scan ordered by urology to r/o kidney stones. Soft tissue prominence near vulva noted on scan Pt reports some dysuria today o/w no GYN complaints  Pap smear UTD  Denies any bowel of bladder dysfunction.   Not sexual active  PE AF VSS Chaperone present  Lungs clear Heart RRR Abd soft + BS GU nl EGBUS, area of varicose veins noted on left vulva, no soft tissue masses noted in mons pubis, vaginal atrophy noted, cervix no  lesions, uterus small, mobile non tender no masses. Exam limited by pt habitus  A/P Vulvar varicose veins        Dysuria  Pt reassured. GYN exam normal. Suspect soft tissue noted on CT scan corresponds to pt's varicose veins. Will check UC F/U PRN

## 2022-10-05 ENCOUNTER — Encounter: Payer: Self-pay | Admitting: Obstetrics and Gynecology

## 2022-10-05 LAB — URINE CULTURE: Organism ID, Bacteria: NO GROWTH

## 2022-10-08 ENCOUNTER — Ambulatory Visit: Payer: Medicaid Other | Admitting: Family Medicine

## 2022-10-08 VITALS — BP 122/70 | Ht 63.0 in | Wt 158.0 lb

## 2022-10-08 DIAGNOSIS — M79642 Pain in left hand: Secondary | ICD-10-CM | POA: Diagnosis not present

## 2022-10-08 DIAGNOSIS — M79641 Pain in right hand: Secondary | ICD-10-CM | POA: Diagnosis not present

## 2022-10-08 DIAGNOSIS — M7711 Lateral epicondylitis, right elbow: Secondary | ICD-10-CM | POA: Diagnosis present

## 2022-10-08 DIAGNOSIS — R21 Rash and other nonspecific skin eruption: Secondary | ICD-10-CM | POA: Diagnosis not present

## 2022-10-08 MED ORDER — PREDNISONE 5 MG PO TABS: ORAL_TABLET | ORAL | 0 refills | Status: DC

## 2022-10-08 NOTE — Assessment & Plan Note (Signed)
Acute on chronic in nature.  Occurring on the dorsum of the hand.  Possible for radial nerve origin. -Counseled on home exercise therapy and supportive care. -Prednisone. - Could consider nerve study

## 2022-10-08 NOTE — Progress Notes (Signed)
  Crystal Gross - 57 y.o. female MRN [id number]  Date of birth: 12/09/1965  SUBJECTIVE:  Including CC & ROS.  No chief complaint on file.   Crystal Gross is a 57 y.o. female that is  presenting with acute on chronic bilateral hand pain and right knee pain and a rash.    Review of Systems See HPI   HISTORY: Past Medical, Surgical, Social, and Family History Reviewed & Updated per EMR.   Pertinent Historical Findings include:  Past Medical History:  Diagnosis Date   Anxiety    UTI (lower urinary tract infection)     Past Surgical History:  Procedure Laterality Date   APPENDECTOMY     COLONOSCOPY  2021     PHYSICAL EXAM:  VS: BP 122/70   Ht 5\' 3"  (1.6 m)   Wt 158 lb (71.7 kg)   LMP 09/03/2014   BMI 27.99 kg/m  Physical Exam Gen: NAD, alert, cooperative with exam, well-appearing MSK:  Neurovascularly intact       ASSESSMENT & PLAN:   Lateral epicondylitis, right elbow Acute on chronic in nature.  Pain is localized to the area and similar to her previous type pain. -Counseled on home exercise therapy and supportive care. -Pursue shockwave therapy  Rash Occurring on the dorsum of her foot.  May have underlying eczematous properties.  Bilateral hand pain Acute on chronic in nature.  Occurring on the dorsum of the hand.  Possible for radial nerve origin. -Counseled on home exercise therapy and supportive care. -Prednisone. - Could consider nerve study

## 2022-10-08 NOTE — Assessment & Plan Note (Signed)
Occurring on the dorsum of her foot.  May have underlying eczematous properties.

## 2022-10-08 NOTE — Assessment & Plan Note (Signed)
Acute on chronic in nature.  Pain is localized to the area and similar to her previous type pain. -Counseled on home exercise therapy and supportive care. -Pursue shockwave therapy

## 2022-10-08 NOTE — Patient Instructions (Signed)
Good to see you Please use heat as needed  Please continue the exercises  We can try shockwave therapy if you would like Please send me a message in MyChart with any questions or updates.  Please see me back as needed.   --Dr. Jordan Likes

## 2022-11-05 ENCOUNTER — Telehealth: Payer: Self-pay

## 2022-11-05 ENCOUNTER — Encounter: Payer: Self-pay | Admitting: Obstetrics and Gynecology

## 2022-11-05 ENCOUNTER — Other Ambulatory Visit (HOSPITAL_COMMUNITY)
Admission: RE | Admit: 2022-11-05 | Discharge: 2022-11-05 | Disposition: A | Discharge status: Home / Self Care | Payer: Medicaid Other | Source: Ambulatory Visit | Attending: Obstetrics and Gynecology | Admitting: Obstetrics and Gynecology

## 2022-11-05 ENCOUNTER — Ambulatory Visit: Payer: Medicaid Other | Admitting: Obstetrics and Gynecology

## 2022-11-05 VITALS — BP 117/79 | HR 103

## 2022-11-05 DIAGNOSIS — N898 Other specified noninflammatory disorders of vagina: Secondary | ICD-10-CM

## 2022-11-05 NOTE — Progress Notes (Signed)
Vaginal dryness and itching

## 2022-11-05 NOTE — Telephone Encounter (Signed)
Attempted to reach pt regarding provider wanting to know why she needed to be seen. Pt does not have vm set up and provider is requesting she be seen at her home office, [facility].

## 2022-11-05 NOTE — Patient Instructions (Signed)
Quarter cup each night of Kefir, or sauerkraut, or kimchee or kombucha to give you probiotics  You can also try compression garment shorts for the vulva and compression stockings/TED hoses to help with your legs
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
## 2022-11-07 LAB — CERVICOVAGINAL ANCILLARY ONLY
Bacterial Vaginitis (gardnerella): NEGATIVE
Candida Glabrata: NEGATIVE
Candida Vaginitis: NEGATIVE
Comment: NEGATIVE
Comment: NEGATIVE
Comment: NEGATIVE
Comment: NEGATIVE
Trichomonas: NEGATIVE

## 2022-11-13 ENCOUNTER — Encounter: Payer: Self-pay | Admitting: Obstetrics and Gynecology

## 2022-11-20 ENCOUNTER — Ambulatory Visit (HOSPITAL_BASED_OUTPATIENT_CLINIC_OR_DEPARTMENT_OTHER)
Admission: RE | Admit: 2022-11-20 | Discharge: 2022-11-20 | Disposition: A | Discharge status: Home / Self Care | Payer: Medicaid Other | Source: Ambulatory Visit | Attending: Family Medicine | Admitting: Family Medicine

## 2022-11-20 ENCOUNTER — Ambulatory Visit: Payer: Medicaid Other | Admitting: Family Medicine

## 2022-11-20 VITALS — BP 120/70 | Ht 63.0 in | Wt 158.0 lb

## 2022-11-20 DIAGNOSIS — S61422A Laceration with foreign body of left hand, initial encounter: Secondary | ICD-10-CM

## 2022-11-20 DIAGNOSIS — S61411A Laceration without foreign body of right hand, initial encounter: Secondary | ICD-10-CM

## 2022-11-20 NOTE — Patient Instructions (Signed)
I would recommend doing x-rays of both hands to assess for small foreign bodies from the cuts in the accident. If these are normal the next step would be either MRI of the worse hand to assess for partial extensor tendon tears or occupational therapy to work on strengthening and regaining your motion. The issue with the right arm is consistent with a radial nerve injury - I would recommend nerve conduction studies/EMGs. Let us know if you want to proceed.

## 2022-11-21 ENCOUNTER — Encounter: Payer: Self-pay | Admitting: Family Medicine

## 2022-11-21 NOTE — Progress Notes (Signed)
PCP: Jordan Hawks, PA-C  Subjective:   HPI: Patient is a 57 y.o. female here for multiple issues.  Patient reports current issues arise from an injury she had at work 4 years ago. States an industrial shade collapsed and fell onto her cutting several areas on her body. This includes dorsal hands where she now has difficulty making a fist and fully extending her wrists.  Associated swelling, tightness. Also reports decreased sensation below her knee anteriorly that never resolved but no weakness in leg. Has a 'fire' sensation up her right arm from about the lateral elbow in upper arm - Dr. Jordan Likes saw her for this, suspected tennis elbow and she went to PT but was told this was not the cause.  Past Medical History:  Diagnosis Date   Anxiety    UTI (lower urinary tract infection)     Current Outpatient Medications on File Prior to Visit  Medication Sig Dispense Refill   Ascorbic Acid (VITAMIN C) 1000 MG tablet Take 1,000 mg by mouth 3 (three) times a week.      Biotin 16109 MCG TABS Take 10,000 mcg by mouth 2 (two) times a week.     cetirizine (ZYRTEC) 10 MG tablet Take 10 mg by mouth daily.     diclofenac (VOLTAREN) 75 MG EC tablet Take 75 mg by mouth 2 (two) times daily.     hydroxypropyl methylcellulose / hypromellose (ISOPTO TEARS / GONIOVISC) 2.5 % ophthalmic solution Place 1 drop into both eyes 4 (four) times daily as needed for dry eyes.     Multiple Vitamins-Minerals (MULTIVITAMIN GUMMIES ADULT PO) Take 2 each by mouth daily.     ondansetron (ZOFRAN-ODT) 8 MG disintegrating tablet Take 8 mg by mouth every 8 (eight) hours as needed.     predniSONE (DELTASONE) 5 MG tablet Take 6 pills for first day, 5 pills second day, 4 pills third day, 3 pills fourth day, 2 pills the fifth day, and 1 pill sixth day. 21 tablet 0   Zinc Sulfate (ZINC 15 PO) Take by mouth.     No current facility-administered medications on file prior to visit.    Past Surgical History:  Procedure Laterality  Date   APPENDECTOMY     COLONOSCOPY  2021    No Known Allergies  BP 120/70   Ht 5\' 3"  (1.6 m)   Wt 158 lb (71.7 kg)   LMP 09/03/2014   BMI 27.99 kg/m       No data to display              No data to display              Objective:  Physical Exam:  Gen: NAD, comfortable in exam room  Bilateral hands/wrists: No deformity, swelling, bruising.Darylene Price about 10 degrees extension bilateral wrists.  FROM digits. With normal strength. No tenderness to palpation. NVI distally.  Right arm: No deformity, swelling, bruising. FROM with 5/5 strength elbow, wrist, digits. No tenderness to palpation lateral epicondyle.  Mild tenderness in extensor mass of forearm proximally and into tricep. NVI distally.   Assessment & Plan:  1. Bilateral hand/wrist pain - reports swelling with this.  Will go ahead with radiographs to assess for possible foreign body.  Can consider MRI of one of the hands to assess for partial extensor tendon tears or occupational therapy to work on fully regaining motion and strength.  2. Right arm pain - unusual distribution.  Could be due to radial nerve injury.  Recommended would proceed with NCV/EMG evaluation of right upper extremity given distribution.  She will think about this.

## 2022-11-25 ENCOUNTER — Encounter: Payer: Self-pay | Admitting: Family Medicine

## 2022-11-26 ENCOUNTER — Encounter: Payer: Self-pay | Admitting: Family Medicine

## 2022-11-27 NOTE — Telephone Encounter (Signed)
Ok to order MRIs of both hands - looking for extensor tendon tears - I may have to send in valium for her claustrophobia;  open MRI preferred.  Ok to also put in x-rays for her right elbow.

## 2022-11-28 ENCOUNTER — Other Ambulatory Visit: Payer: Self-pay | Admitting: *Deleted

## 2022-11-28 DIAGNOSIS — M79642 Pain in left hand: Secondary | ICD-10-CM

## 2022-11-28 DIAGNOSIS — M79641 Pain in right hand: Secondary | ICD-10-CM

## 2022-11-28 DIAGNOSIS — M7711 Lateral epicondylitis, right elbow: Secondary | ICD-10-CM

## 2022-12-03 ENCOUNTER — Ambulatory Visit (HOSPITAL_BASED_OUTPATIENT_CLINIC_OR_DEPARTMENT_OTHER)
Admission: RE | Admit: 2022-12-03 | Discharge: 2022-12-03 | Disposition: A | Discharge status: Home / Self Care | Payer: Medicaid Other | Source: Ambulatory Visit | Attending: Family Medicine | Admitting: Family Medicine

## 2022-12-03 DIAGNOSIS — M7711 Lateral epicondylitis, right elbow: Secondary | ICD-10-CM | POA: Diagnosis present

## 2022-12-07 ENCOUNTER — Telehealth (HOSPITAL_BASED_OUTPATIENT_CLINIC_OR_DEPARTMENT_OTHER): Payer: Self-pay

## 2022-12-10 ENCOUNTER — Encounter: Payer: Self-pay | Admitting: Family Medicine

## 2022-12-11 NOTE — Telephone Encounter (Signed)
Crystal Gross -   Can you refer her to neurology for right arm pain/burning - consideration of NCV/EMGs.  Thanks!

## 2022-12-12 ENCOUNTER — Other Ambulatory Visit: Payer: Self-pay | Admitting: *Deleted

## 2022-12-12 ENCOUNTER — Encounter: Payer: Self-pay | Admitting: Family Medicine

## 2022-12-12 DIAGNOSIS — M79601 Pain in right arm: Secondary | ICD-10-CM

## 2022-12-20 ENCOUNTER — Encounter: Payer: Self-pay | Admitting: Family Medicine

## 2023-01-02 ENCOUNTER — Encounter: Payer: Self-pay | Admitting: Family Medicine

## 2023-03-18 ENCOUNTER — Ambulatory Visit: Payer: Medicaid Other | Admitting: Family Medicine

## 2023-03-18 VITALS — BP 124/71 | Ht 68.0 in | Wt 158.0 lb

## 2023-03-18 DIAGNOSIS — M79601 Pain in right arm: Secondary | ICD-10-CM | POA: Diagnosis not present

## 2023-03-18 DIAGNOSIS — M79641 Pain in right hand: Secondary | ICD-10-CM

## 2023-03-18 DIAGNOSIS — M79642 Pain in left hand: Secondary | ICD-10-CM | POA: Diagnosis not present

## 2023-03-18 NOTE — Patient Instructions (Signed)
Start occupational therapy for your hands. Consider the nerve conduction study we talked about also. For your right foot pain make sure you wear shoes with a wide toebox. Metatarsal pads would be beneficial - we can place these for you if you want but they do better in a running/tennis shoe.  For arthritis: These are the different medications you can take for this: Tylenol 500mg  1-2 tabs three times a day for pain. Voltaren gel, capsaicin, aspercreme, or biofreeze topically up to four times a day may also help with pain. Some supplements that may help for arthritis: Boswellia extract, curcumin, pycnogenol Aleve 1-2 tabs twice a day with food Heat or ice 15 minutes at a time 3-4 times a day as needed to help with pain. Follow up with me in 6 weeks.

## 2023-03-19 ENCOUNTER — Encounter: Payer: Self-pay | Admitting: Family Medicine

## 2023-03-19 NOTE — Progress Notes (Signed)
PCP: Jordan Hawks, PA-C  Subjective:   HPI: Patient is a 57 y.o. female here for multiple issues.  6/18: Patient reports current issues arise from an injury she had at work 4 years ago. States an industrial shade collapsed and fell onto her cutting several areas on her body. This includes dorsal hands where she now has difficulty making a fist and fully extending her wrists.  Associated swelling, tightness. Also reports decreased sensation below her knee anteriorly that never resolved but no weakness in leg. Has a 'fire' sensation up her right arm from about the lateral elbow in upper arm - Dr. Jordan Likes saw her for this, suspected tennis elbow and she went to PT but was told this was not the cause.  10/14: Patient returns with continued issues of her hands/arms - no changes here. MRI was denied of her hands to assess for tendon injuries. She's having some discomfort lateral right foot, with mild redness, swelling here. No acute injury.  Past Medical History:  Diagnosis Date   Anxiety    UTI (lower urinary tract infection)     Current Outpatient Medications on File Prior to Visit  Medication Sig Dispense Refill   Ascorbic Acid (VITAMIN C) 1000 MG tablet Take 1,000 mg by mouth 3 (three) times a week.      Biotin 08657 MCG TABS Take 10,000 mcg by mouth 2 (two) times a week.     cetirizine (ZYRTEC) 10 MG tablet Take 10 mg by mouth daily.     hydroxypropyl methylcellulose / hypromellose (ISOPTO TEARS / GONIOVISC) 2.5 % ophthalmic solution Place 1 drop into both eyes 4 (four) times daily as needed for dry eyes.     Multiple Vitamins-Minerals (MULTIVITAMIN GUMMIES ADULT PO) Take 2 each by mouth daily.     ondansetron (ZOFRAN-ODT) 8 MG disintegrating tablet Take 8 mg by mouth every 8 (eight) hours as needed.     predniSONE (DELTASONE) 5 MG tablet Take 6 pills for first day, 5 pills second day, 4 pills third day, 3 pills fourth day, 2 pills the fifth day, and 1 pill sixth day. 21 tablet 0    Zinc Sulfate (ZINC 15 PO) Take by mouth.     No current facility-administered medications on file prior to visit.    Past Surgical History:  Procedure Laterality Date   APPENDECTOMY     COLONOSCOPY  2021    No Known Allergies  BP 124/71   Ht 5\' 8"  (1.727 m)   Wt 158 lb (71.7 kg)   LMP 09/03/2014   BMI 24.02 kg/m       No data to display              No data to display              Objective:  Physical Exam:  Gen: NAD, comfortable in exam room  Right foot/ankle: Transverse arch collapse.  Bunionette noted with mild redness, no warmth or swelling. Full range of motion ankle, digits. No tenderness to palpation  NV intact distally.   Assessment & Plan:  1. Bilateral hand/wrist pain - No changes.  MRIs were denied of hands.  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  Start occupational therapy for bilateral hand pain.  Arthritis instructions provided.  F/u in 6 weeks.  2. Right arm pain - unusual distribution.  Possible radial nerve injury.  Encouraged NCV/EMG right upper extremity.  3. Right foot pain -  2/2 transverse arch collapse with bunionette.  Metatarsal pads or cookies.

## 2023-03-25 ENCOUNTER — Other Ambulatory Visit: Payer: Self-pay

## 2023-03-25 DIAGNOSIS — M79641 Pain in right hand: Secondary | ICD-10-CM

## 2023-03-25 NOTE — Progress Notes (Signed)
Pt called asking to update OT referral to [facility] in [place], [place]  New OT order faxed.

## 2023-03-26 ENCOUNTER — Other Ambulatory Visit: Payer: Self-pay | Admitting: Physician Assistant

## 2023-03-26 DIAGNOSIS — Z1231 Encounter for screening mammogram for malignant neoplasm of breast: Secondary | ICD-10-CM

## 2023-05-06 ENCOUNTER — Other Ambulatory Visit: Payer: Self-pay | Admitting: Physician Assistant

## 2023-05-06 ENCOUNTER — Ambulatory Visit: Payer: Medicaid Other | Admitting: Family Medicine

## 2023-05-06 DIAGNOSIS — R590 Localized enlarged lymph nodes: Secondary | ICD-10-CM

## 2023-05-13 ENCOUNTER — Ambulatory Visit: Payer: Medicaid Other

## 2023-05-16 ENCOUNTER — Ambulatory Visit
Admission: RE | Admit: 2023-05-16 | Discharge: 2023-05-16 | Disposition: A | Discharge status: Home / Self Care | Payer: Medicaid Other | Source: Ambulatory Visit | Attending: Physician Assistant | Admitting: Physician Assistant

## 2023-05-16 DIAGNOSIS — R590 Localized enlarged lymph nodes: Secondary | ICD-10-CM

## 2023-05-17 ENCOUNTER — Encounter: Payer: Self-pay | Admitting: Obstetrics and Gynecology

## 2023-06-14 ENCOUNTER — Ambulatory Visit: Payer: Medicaid Other | Admitting: Podiatry

## 2023-06-14 DIAGNOSIS — M2041 Other hammer toe(s) (acquired), right foot: Secondary | ICD-10-CM

## 2023-06-14 DIAGNOSIS — I872 Venous insufficiency (chronic) (peripheral): Secondary | ICD-10-CM

## 2023-06-14 DIAGNOSIS — R21 Rash and other nonspecific skin eruption: Secondary | ICD-10-CM | POA: Diagnosis not present

## 2023-06-14 DIAGNOSIS — M79673 Pain in unspecified foot: Secondary | ICD-10-CM

## 2023-06-14 DIAGNOSIS — M2042 Other hammer toe(s) (acquired), left foot: Secondary | ICD-10-CM | POA: Diagnosis not present

## 2023-06-14 DIAGNOSIS — M21611 Bunion of right foot: Secondary | ICD-10-CM

## 2023-06-14 DIAGNOSIS — M21621 Bunionette of right foot: Secondary | ICD-10-CM

## 2023-06-14 MED ORDER — CLOTRIMAZOLE-BETAMETHASONE 1-0.05 % EX CREA: 1.0000 | TOPICAL_CREAM | Freq: Every day | CUTANEOUS | 0 refills | Status: AC

## 2023-06-14 NOTE — Progress Notes (Addendum)
 Chief Complaint  Patient presents with   Foot Pain    Vein pain is in LT ankle & foot. Some swelling as well. She also has a rash on top of the left foot. She did see a vasc. Doctor 5 years ago but could no longer go d/t ins. Coverage.   RT & LT Foot is painful across top of all 5 toes and on the lateral side of the 5th hallux. Pain is present all day but worse at night. No known injury.     Patient is prediabetic has many issues that she wants to have addressed today.  Left varicose veins and venous insufficiency also causing an itchy rash on the right medial left ankle and dorsum of the foot.  She states that she was previously scheduled for venous procedure  but insurance would not cover it at the time.  She like to possibly get reevaluated to have procedures done to help her veins.  She does not wear compression stockings often.  She states that she is more likely to wear them in the wintertime.-She was given information on the elastic therapy outlet store instructed to wear the compression stockings at all times except when sleeping.  For the rash on the left ankle and foot will send in Lotrisone  cream to apply daily.  Mild bunion and tailor's bunion on the right.  No redness or swelling noted.  No pain on palpation today when the patient was distracted.  However patient notes that she has significant pain in these areas and wants to have them straightened as soon as possible.  She also has flexible hammertoes bilateral feet.  The left hammertoes are slightly more severe than the right.  There is no pain on palpation during the exam but she stated these are also killing her in length and treated as soon as possible.  A mini double gel buddy loop was dispensed for the right fourth and fifth toes.  Recommended power step arch supports to hold the feet in better position to see if this helps any joint pain that she is having across the MPJs.  This is based on her subjective report and not  based on clinical findings since that was negative.  Will x-ray the feet at next appointment to evaluate the forefoot deformities.  Informed patient that surgery is not up for discussion at her first appointment today and will attempt conservative measures first.  Her shoes do appear slightly too narrow in the forefoot.  This may be aggravating some of her discomfort.  Patient also wanted her nails trimmed today.  Informed her that she does not have any class findings to have this performed.  She was quoted the pricing and urged to get this done at a nail salon would be much cheaper.  She does not have any significant toenail deformities present.   Assessment: 1. Venous insufficiency (chronic) (peripheral)   2. Rash   3. Hammertoe of right foot   4. Hammertoe of left foot   5. Bunion of great toe of right foot   6. Tailor's bunionette, right      Meds ordered this encounter  Medications   clotrimazole -betamethasone  (LOTRISONE ) cream    Sig: Apply 1 Application topically daily.    Dispense:  30 g    Refill:  0   AMB REFERRAL TO VASCULAR SURGERY/CLINIC - consultation for venous disease.  Eval & Treat.    Awanda CHARM Imperial, DPM, FACFAS [facility]     [address] [place], [place] [address]                Office [phone]  Fax [phone]

## 2023-06-17 ENCOUNTER — Encounter: Payer: Self-pay | Admitting: Podiatry

## 2023-07-02 ENCOUNTER — Encounter: Payer: Self-pay | Admitting: Obstetrics and Gynecology

## 2023-07-02 ENCOUNTER — Ambulatory Visit (INDEPENDENT_AMBULATORY_CARE_PROVIDER_SITE_OTHER): Payer: Medicaid Other | Admitting: Obstetrics and Gynecology

## 2023-07-02 ENCOUNTER — Other Ambulatory Visit (HOSPITAL_COMMUNITY)
Admission: RE | Admit: 2023-07-02 | Discharge: 2023-07-02 | Disposition: A | Discharge status: Home / Self Care | Payer: Medicaid Other | Source: Ambulatory Visit | Attending: Obstetrics and Gynecology | Admitting: Obstetrics and Gynecology

## 2023-07-02 VITALS — BP 121/78 | HR 97 | Ht 63.0 in | Wt 159.0 lb

## 2023-07-02 DIAGNOSIS — Z01419 Encounter for gynecological examination (general) (routine) without abnormal findings: Secondary | ICD-10-CM

## 2023-07-02 DIAGNOSIS — Z1151 Encounter for screening for human papillomavirus (HPV): Secondary | ICD-10-CM | POA: Diagnosis not present

## 2023-07-02 DIAGNOSIS — N898 Other specified noninflammatory disorders of vagina: Secondary | ICD-10-CM | POA: Diagnosis not present

## 2023-07-02 DIAGNOSIS — Z124 Encounter for screening for malignant neoplasm of cervix: Secondary | ICD-10-CM

## 2023-07-02 LAB — POCT URINALYSIS DIPSTICK
Bilirubin, UA: NEGATIVE
Blood, UA: NEGATIVE
Glucose, UA: NEGATIVE
Ketones, UA: NEGATIVE
Leukocytes, UA: NEGATIVE
Nitrite, UA: NEGATIVE
Protein, UA: NEGATIVE
Urobilinogen, UA: 0.2 U/dL
pH, UA: 6.5 (ref 5.0–8.0)

## 2023-07-02 NOTE — Progress Notes (Unsigned)
Patient presents for Annual.   Last pap: Date: 08/2020 WNL  Mammogram: Up to date: 05/16/2023 WNL   CC:  vaginal itching unsure if caused by soap that has tree oil in it. Pt requesting urine cx.

## 2023-07-03 LAB — URINE CULTURE
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
## 2023-07-04 ENCOUNTER — Encounter: Payer: Self-pay | Admitting: Obstetrics and Gynecology

## 2023-07-04 LAB — CYTOLOGY - PAP
Comment: NEGATIVE
Diagnosis: NEGATIVE
High risk HPV: NEGATIVE

## 2023-07-06 LAB — NUSWAB VG+, CANDIDA 6SP
C PARAPSILOSIS/TROPICALIS: NEGATIVE
Candida albicans, NAA: NEGATIVE
Candida glabrata, NAA: NEGATIVE
Candida krusei, NAA: NEGATIVE
Candida lusitaniae, NAA: NEGATIVE
Chlamydia trachomatis, NAA: NEGATIVE
Neisseria gonorrhoeae, NAA: NEGATIVE
Trich vag by NAA: NEGATIVE

## 2023-07-09 ENCOUNTER — Encounter: Payer: Self-pay | Admitting: Obstetrics and Gynecology

## 2023-07-26 ENCOUNTER — Ambulatory Visit: Payer: Medicaid Other | Admitting: Podiatry

## 2023-07-26 DIAGNOSIS — M2041 Other hammer toe(s) (acquired), right foot: Secondary | ICD-10-CM

## 2023-07-26 DIAGNOSIS — I739 Peripheral vascular disease, unspecified: Secondary | ICD-10-CM

## 2023-07-26 DIAGNOSIS — M2042 Other hammer toe(s) (acquired), left foot: Secondary | ICD-10-CM | POA: Diagnosis not present

## 2023-07-26 DIAGNOSIS — I872 Venous insufficiency (chronic) (peripheral): Secondary | ICD-10-CM

## 2023-07-28 NOTE — Progress Notes (Signed)
      Chief Complaint  Patient presents with   Foot Pain    No xrays today. She has vascular apt in March.  She wanted to keep todays apt. Rash is gone, ( I do not see a break out).  The toes on the left foot are the worst. Not diabetic, no anticoag   HPI: 58 y.o. female presents today wanting to discuss surgical intervention for her hammertoes and forefoot issues.  She has been wearing the crest pad and spacers but states that she still has discomfort.  She was fitted for power step inserts and has these in her shoes today but does not feel that they provide significant improvement.  She was referred for a vascular consult at the last visit, but according to the chart, she has not scheduled this yet.  She was informed that we would need this performed, and any vascular intervention completed, prior to any surgical consultation.  She did get compression knee-high stockings since last seen and has been wearing them but does not have them on today.  Past Medical History:  Diagnosis Date   Anxiety    UTI (lower urinary tract infection)     Past Surgical History:  Procedure Laterality Date   APPENDECTOMY     COLONOSCOPY  2021   No Known Allergies   Physical Exam: There are nonpalpable pedal pulses bilateral.  The toes are cold bilateral.  There are multiple telangiectasias noted more so on the left lower leg and ankle versus the right.  There are some tortuous veins seen in the middle portion of the leg on the left as well.  The contractures of the lesser toes are flexible in nature.  There is no distal hyperkeratotic lesion seen at the tips of the toes today.  Assessment/Plan of Care: 1. PVD (peripheral vascular disease) (HCC)   2. Venous insufficiency (chronic) (peripheral)   3. Hammertoe of right foot   4. Hammertoe of left foot    VAS [place] ABI WITH/WO TBI  Discussed clinical findings with patient today.  Informed the patient that we would hold off on obtaining any x-rays and  discussing/planning for foot surgery until we get the consultations performed and treatment rendered.  Added ABI order with TBI to evaluate arterial flow as well since her pedal pulses are nonpalpable.  These may need to be performed at different locations from the venous consultation.  Follow-up after the vascular consult and studies have been performed.  Dispensed to Silipos toe caps and gel bunion guards as these may fit better while wearing compression stockings.  Patient informed if this is too bulky in her shoes, she would need to remove the bunion shields.   Clerance Lav, DPM, FACFAS [facility]     [address] [place], [place] [address]                Office [phone]  Fax [phone]

## 2023-08-06 ENCOUNTER — Other Ambulatory Visit (HOSPITAL_COMMUNITY): Payer: Self-pay | Admitting: Podiatry

## 2023-08-06 DIAGNOSIS — I8392 Asymptomatic varicose veins of left lower extremity: Secondary | ICD-10-CM
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
## 2023-08-13 ENCOUNTER — Ambulatory Visit (HOSPITAL_COMMUNITY)
Admission: RE | Admit: 2023-08-13 | Discharge: 2023-08-13 | Disposition: A | Discharge status: Home / Self Care | Payer: Medicaid Other | Source: Ambulatory Visit | Attending: Podiatry | Admitting: Podiatry

## 2023-08-13 ENCOUNTER — Encounter: Payer: Self-pay | Admitting: Vascular Surgery

## 2023-08-13 ENCOUNTER — Ambulatory Visit (HOSPITAL_COMMUNITY)
Admission: RE | Admit: 2023-08-13 | Discharge: 2023-08-13 | Disposition: A | Discharge status: Home / Self Care | Payer: Medicaid Other | Source: Ambulatory Visit | Attending: Vascular Surgery | Admitting: Vascular Surgery

## 2023-08-13 ENCOUNTER — Ambulatory Visit: Payer: Medicaid Other | Admitting: Vascular Surgery

## 2023-08-13 VITALS — BP 104/76 | HR 96 | Temp 98.2°F | Ht 63.0 in | Wt 156.0 lb

## 2023-08-13 DIAGNOSIS — I872 Venous insufficiency (chronic) (peripheral): Secondary | ICD-10-CM | POA: Diagnosis not present

## 2023-08-13 DIAGNOSIS — I739 Peripheral vascular disease, unspecified: Secondary | ICD-10-CM | POA: Insufficient documentation

## 2023-08-13 DIAGNOSIS — I8392 Asymptomatic varicose veins of left lower extremity: Secondary | ICD-10-CM

## 2023-08-14 ENCOUNTER — Encounter: Payer: Self-pay | Admitting: Podiatry

## 2023-08-14 ENCOUNTER — Telehealth: Payer: Self-pay | Admitting: Podiatry

## 2023-08-14 ENCOUNTER — Telehealth: Payer: Self-pay

## 2023-08-14 NOTE — Telephone Encounter (Signed)
 Pt called back returning your call about the vascular study results. She got your message and did see a doctor yesterday after her vascular studies were done and they have her scheduled to follow up in July with Dr Aquilla Hacker. She is not sure what you were talking about with the vascular surgeon. She has only seen the one doctor yesterday for the vascular studies and is confused about it.

## 2023-08-14 NOTE — Telephone Encounter (Signed)
 Pt called with confusion about her podiatrist telling her she did not see the right provider yesterday. She was here in the office and had a reflux study and saw MD. We went back over the results and MD note. She has clarification and is scheduled for 3 month f/u for as possible laser ablation candidate. I have routed podiatry her reflux study and office note. We discussed signs/symptoms of a DVT, as she is worried about getting one. She has never had one. All questions/concerns answered.

## 2023-12-09 ENCOUNTER — Encounter: Payer: Self-pay | Admitting: Surgery

## 2023-12-09 ENCOUNTER — Ambulatory Visit: Attending: Surgery | Admitting: Surgery

## 2023-12-09 VITALS — BP 114/74 | HR 105 | Temp 98.4°F | Ht 63.0 in | Wt 155.0 lb

## 2023-12-09 DIAGNOSIS — I83892 Varicose veins of left lower extremities with other complications: Secondary | ICD-10-CM | POA: Diagnosis not present

## 2023-12-09 NOTE — Progress Notes (Signed)
 Vascular and Vein Specialist of [facility]  Patient name: Crystal Gross MRN: [id number] DOB: 1966-04-26 Sex: female   REASON FOR VISIT:    Follow up  HISOTRY OF PRESENT ILLNESS:    Crystal Gross is a 58 y.o. female who returns today for follow-up.  She was originally seen by Dr. Freida 3 months ago for varicose vein evaluation of both legs, left greater than right.  She has had these for quite some time but has become increasingly bothersome to her.  She complains of pain as well as swelling in the foot and ankle that get worse throughout the course of the day.  At her last visit, it was recommended that she wear 20-30 thigh-high compression stockings, keep her legs elevated when possible, and begin routine exercise.  She had trouble wearing the compression socks because of the heat and how uncomfortable they were, however when she did wear them she felt better.   PAST MEDICAL HISTORY:   Past Medical History:  Diagnosis Date   Anxiety    UTI (lower urinary tract infection)      FAMILY HISTORY:   Family History  Problem Relation Age of Onset   Cancer Father    Heart failure Father    Migraines Father    Diabetes Mother    Hypertension Mother    Stroke Mother    Colon cancer Sister     SOCIAL HISTORY:   Social History   Tobacco Use   Smoking status: Never   Smokeless tobacco: Never  Substance Use Topics   Alcohol use: No     ALLERGIES:   No Known Allergies   CURRENT MEDICATIONS:   Current Outpatient Medications  Medication Sig Dispense Refill   Ascorbic Acid (VITAMIN C) 1000 MG tablet Take 1,000 mg by mouth 3 (three) times a week.      b complex vitamins capsule Take 1 capsule by mouth daily.     clotrimazole -betamethasone  (LOTRISONE ) cream Apply 1 Application topically daily. 30 g 0   Ergocalciferol (VITAMIN D2 PO) Take by mouth.     Multiple Vitamins-Minerals (MULTIVITAMIN GUMMIES ADULT PO) Take 2 each  by mouth daily.     ondansetron (ZOFRAN-ODT) 8 MG disintegrating tablet Take 8 mg by mouth every 8 (eight) hours as needed.     No current facility-administered medications for this visit.    REVIEW OF SYSTEMS:   [X]  denotes positive finding, [ ]  denotes negative finding Cardiac  Comments:  Chest pain or chest pressure:    Shortness of breath upon exertion:    Short of breath when lying flat:    Irregular heart rhythm:        Vascular    Pain in calf, thigh, or hip brought on by ambulation:    Pain in feet at night that wakes you up from your sleep:     Blood clot in your veins:    Leg swelling:  x       Pulmonary    Oxygen at home:    Productive cough:     Wheezing:         Neurologic    Sudden weakness in arms or legs:     Sudden numbness in arms or legs:     Sudden onset of difficulty speaking or slurred speech:    Temporary loss of vision in one eye:     Problems with dizziness:         Gastrointestinal    Blood in stool:  Vomited blood:         Genitourinary    Burning when urinating:     Blood in urine:        Psychiatric    Major depression:         Hematologic    Bleeding problems:    Problems with blood clotting too easily:        Skin    Rashes or ulcers:        Constitutional    Fever or chills:      PHYSICAL EXAM:   Vitals:   12/09/23 1208  BP: 114/74  Pulse: (!) 105  Temp: 98.4 F (36.9 C)  SpO2: 95%  Weight: 155 lb (70.3 kg)  Height: 5' 3 (1.6 m)    GENERAL: The patient is a well-nourished female, in no acute distress. The vital signs are documented above. CARDIAC: There is a regular rate and rhythm.  VASCULAR: SonoSite was used to left great saphenous vein.  It is very dilated from below the knee to the mid thigh and then gets very small rounded varicosities.  She has multiple varicosities of her lower leg and to the medial thigh PULMONARY: Non-labored respirations ABDOMEN: Soft and non-tender with normal pitched bowel sounds.   MUSCULOSKELETAL: There are no major deformities or cyanosis. NEUROLOGIC: No focal weakness or paresthesias are detected. SKIN: There are no ulcers or rashes noted. PSYCHIATRIC: The patient has a normal affect.  STUDIES:   I have reviewed the following: LEFT          Reflux NoRefluxReflux TimeDiameter cmsComments                          Yes                                   +--------------+---------+------+-----------+------------+--------+  CFV                    yes   >1 second                       +--------------+---------+------+-----------+------------+--------+  FV mid        no                                              +--------------+---------+------+-----------+------------+--------+  Popliteal    no                                              +--------------+---------+------+-----------+------------+--------+  GSV at SFJ              yes    >500 ms      0.74              +--------------+---------+------+-----------+------------+--------+  GSV prox thigh          yes    >500 ms      0.56              +--------------+---------+------+-----------+------------+--------+  GSV mid thigh no  0.2               +--------------+---------+------+-----------+------------+--------+  GSV dist thigh          yes    >500 ms      0.57              +--------------+---------+------+-----------+------------+--------+  GSV at knee             yes    >500 ms      0.62              +--------------+---------+------+-----------+------------+--------+  GSV prox calf           yes    >500 ms      0.55              +--------------+---------+------+-----------+------------+--------+  SSV Pop Fossa no                            0.18              +--------------+---------+------+-----------+------------+--------+  SSV prox calf no                            0.15               +--------------+---------+------+-----------+------------+-----  MEDICAL ISSUES:   CEAP III, left leg: The patient has tried nonoperative measures including leg elevation, compression, and exercise and still has persistent symptoms.  I discussed proceeding with endovenous laser ablation of the left great saphenous vein with greater than 20 stabs to treat her pain and swelling.  Her saphenous vein does get small in the mid thigh.  There is a possibility that I will only a able to ablate from below the knee to the mid thigh on the second navigate through the narrowing.  I think she would benefit from the stabs to help with the reflux.  We will consider sclerotherapy because she has several areas around the ankle that are bothering her.  I did discuss that I would not perform step phlebectomies at the level of the ankle.  She is eager to get this done although she does have some anxiety.  We will work on Therapist, occupational.    Malvina New, [place], MD, FACS [facility] [phone] Pager [phone]

## 2024-01-01 ENCOUNTER — Other Ambulatory Visit: Payer: Self-pay | Admitting: *Deleted

## 2024-01-01 DIAGNOSIS — I83892 Varicose veins of left lower extremities with other complications: Secondary | ICD-10-CM

## 2024-01-22 ENCOUNTER — Other Ambulatory Visit: Payer: Self-pay | Admitting: *Deleted

## 2024-01-22 DIAGNOSIS — I83892 Varicose veins of left lower extremities with other complications: Secondary | ICD-10-CM

## 2024-05-14 ENCOUNTER — Other Ambulatory Visit: Payer: Self-pay | Admitting: *Deleted

## 2024-05-14 MED ORDER — LORAZEPAM 1 MG PO TABS: ORAL_TABLET | ORAL | 0 refills | Status: AC

## 2024-05-19 ENCOUNTER — Other Ambulatory Visit: Payer: Self-pay | Admitting: *Deleted

## 2024-05-19 ENCOUNTER — Telehealth: Payer: Self-pay | Admitting: *Deleted

## 2024-05-19 DIAGNOSIS — I83892 Varicose veins of left lower extremities with other complications: Secondary | ICD-10-CM

## 2024-05-19 NOTE — Telephone Encounter (Signed)
 Spoke with patient and Dr. Serene about patient's concerns regarding surgery.  Offered to cancel 05-21-2024 office surgery and schedule VV FU with Dr. Serene in January to address Crystal Gross's questions and concerns.  Crystal Gross agreed with this plan and VVS scheduler will call her to set up appointment. Also, will schedule venous reflux exam (left leg, order in EPIC) as last venous reflux study was done in March 2025.

## 2024-05-21 ENCOUNTER — Ambulatory Visit: Admitting: Surgery

## 2024-05-25 ENCOUNTER — Ambulatory Visit (HOSPITAL_COMMUNITY)
Admission: RE | Admit: 2024-05-25 | Discharge: 2024-05-25 | Disposition: A | Discharge status: Home / Self Care | Source: Ambulatory Visit | Attending: Surgery | Admitting: Surgery

## 2024-05-25 DIAGNOSIS — I83892 Varicose veins of left lower extremities with other complications: Secondary | ICD-10-CM | POA: Insufficient documentation

## 2024-06-08 ENCOUNTER — Encounter: Admitting: Surgery

## 2024-06-08 ENCOUNTER — Encounter (HOSPITAL_COMMUNITY)

## 2024-06-15 ENCOUNTER — Ambulatory Visit: Attending: Surgery | Admitting: Surgery

## 2024-06-15 ENCOUNTER — Encounter: Payer: Self-pay | Admitting: Surgery

## 2024-06-15 VITALS — BP 123/79 | HR 112 | Temp 97.7°F | Resp 18 | Ht 63.0 in | Wt 161.4 lb

## 2024-06-15 DIAGNOSIS — I83892 Varicose veins of left lower extremities with other complications: Secondary | ICD-10-CM | POA: Diagnosis not present

## 2024-06-15 DIAGNOSIS — M7989 Other specified soft tissue disorders: Secondary | ICD-10-CM

## 2024-06-15 NOTE — Progress Notes (Signed)
 "                                    Vascular and Vein Specialist of [facility]  Patient name: Crystal Gross MRN: [id number] DOB: 10-06-1965 Sex: female   REASON FOR VISIT:    Follow up  HISOTRY OF PRESENT ILLNESS:    Crystal Gross is a 59 y.o. female who returns today for follow-up of her varicose veins.  These have been present for many years but are becoming increasingly bothersome.  She complains of pain as well as swelling.  Her left bothers her more than the right.  She wears 20-30 thigh-high compression stockings.  She keeps her legs elevated when possible.  She does routine exercise.  She was found to have significant left great saphenous reflux and was scheduled for endovenous laser ablation with greater than 20 stabs on December 18, however she had several questions and so we elected to cancel this.   PAST MEDICAL HISTORY:   Past Medical History:  Diagnosis Date   Anxiety    UTI (lower urinary tract infection)      FAMILY HISTORY:   Family History  Problem Relation Age of Onset   Cancer Father    Heart failure Father    Migraines Father    Diabetes Mother    Hypertension Mother    Stroke Mother    Colon cancer Sister     SOCIAL HISTORY:   Social History   Tobacco Use   Smoking status: Never   Smokeless tobacco: Never  Substance Use Topics   Alcohol use: No     ALLERGIES:   Allergies[1]   CURRENT MEDICATIONS:   Current Outpatient Medications  Medication Sig Dispense Refill   Ascorbic Acid (VITAMIN C) 1000 MG tablet Take 1,000 mg by mouth 3 (three) times a week.      b complex vitamins capsule Take 1 capsule by mouth daily.     clotrimazole -betamethasone  (LOTRISONE ) cream Apply 1 Application topically daily. 30 g 0   Ergocalciferol (VITAMIN D2 PO) Take by mouth.     LORazepam  (ATIVAN ) 1 MG tablet Take 2  tablets 30 to 60 minutes prior to leaving house on day of office surgery.  Bring third tablet with you to office on day of office  surgery. 3 tablet 0   Multiple Vitamins-Minerals (MULTIVITAMIN GUMMIES ADULT PO) Take 2 each by mouth daily.     ondansetron (ZOFRAN-ODT) 8 MG disintegrating tablet Take 8 mg by mouth every 8 (eight) hours as needed.     No current facility-administered medications for this visit.    REVIEW OF SYSTEMS:   [X]  denotes positive finding, [ ]  denotes negative finding Cardiac  Comments:  Chest pain or chest pressure:    Shortness of breath upon exertion:    Short of breath when lying flat:    Irregular heart rhythm:        Vascular    Pain in calf, thigh, or hip brought on by ambulation:    Pain in feet at night that wakes you up from your sleep:     Blood clot in your veins:    Leg swelling:  x       Pulmonary    Oxygen at home:    Productive cough:     Wheezing:         Neurologic    Sudden weakness in arms or legs:  Sudden numbness in arms or legs:     Sudden onset of difficulty speaking or slurred speech:    Temporary loss of vision in one eye:     Problems with dizziness:         Gastrointestinal    Blood in stool:     Vomited blood:         Genitourinary    Burning when urinating:     Blood in urine:        Psychiatric    Major depression:         Hematologic    Bleeding problems:    Problems with blood clotting too easily:        Skin    Rashes or ulcers:        Constitutional    Fever or chills:      PHYSICAL EXAM:   There were no vitals filed for this visit.  GENERAL: The patient is a well-nourished female, in no acute distress. The vital signs are documented above. CARDIAC: There is a regular rate and rhythm.  VASCULAR: Ultrasound was used to evaluate the left great saphenous vein which is dilated up to the mid thigh and thendown.  She has several clusters of varicosities and trace edema SKIN: There are no ulcers or rashes noted. PSYCHIATRIC: The patient has a normal affect.     STUDIES:    --------------+---------+------+-----------+------------+---------+  LEFT         Reflux NoRefluxReflux TimeDiameter cmsComments                           Yes                                    +--------------+---------+------+-----------+------------+---------+  CFV                    yes   >1 second                        +--------------+---------+------+-----------+------------+---------+  FV mid        no                                               +--------------+---------+------+-----------+------------+---------+  FV dist                 yes   >1 second                        +--------------+---------+------+-----------+------------+---------+  Popliteal              yes   >1 second                        +--------------+---------+------+-----------+------------+---------+  GSV at SFJ              yes    >500 ms      0.61               +--------------+---------+------+-----------+------------+---------+  GSV prox thigh          yes    >500 ms      0.47               +--------------+---------+------+-----------+------------+---------+  GSV mid thigh no                            0.25               +--------------+---------+------+-----------+------------+---------+  GSV dist thigh          yes    >500 ms  0.19 / 0.57            +--------------+---------+------+-----------+------------+---------+  GSV at knee             yes    >500 ms      0.48               +--------------+---------+------+-----------+------------+---------+  GSV prox calf           yes    >500 ms      0.46               +--------------+---------+------+-----------+------------+---------+  GSV mid calf            yes    >500 ms      0.43               +--------------+---------+------+-----------+------------+---------+  SSV at [facility]    no                            0.18                +--------------+---------+------+-----------+------------+---------+  SSV prox calf                                       too small  +--------------+---------+------+-----------+------------+---------+  AASV o        no                            0.32               +--------------+---------+------+-----------+------------+---------+    MEDICAL ISSUES:   CEAP class III, left leg: The patient was originally scheduled for laser ablation with stabs and December however she had concerns about the procedure so this was canceled.  She is back today for further discussions.  We again went over proceeding with laser ablation with 1020 stabs and then sclerotherapy, she had a repeat ultrasound today which shows persistent reflux.  Will work on getting her scheduled for the procedure in the near future.    Malvina Serene [place], MD, FACS [facility] Special[facility] of [facility] [phone] Pager [phone]     [1] No Known Allergies  "

## 2024-07-10 ENCOUNTER — Encounter: Payer: Self-pay | Admitting: Surgery

## 2024-07-30 ENCOUNTER — Ambulatory Visit: Admitting: Obstetrics and Gynecology
